# Patient Record
Sex: Female | Born: 1970 | Race: White | Hispanic: No | Marital: Married | Smoking: Never smoker
Health system: Southern US, Community
[De-identification: ages and names within clinical notes are randomized; demographics above are authoritative.]

## PROBLEM LIST (undated history)

## (undated) DIAGNOSIS — T7840XA Allergy, unspecified, initial encounter: Secondary | ICD-10-CM

## (undated) DIAGNOSIS — K219 Gastro-esophageal reflux disease without esophagitis: Secondary | ICD-10-CM

## (undated) DIAGNOSIS — F32A Depression, unspecified: Secondary | ICD-10-CM

## (undated) DIAGNOSIS — E119 Type 2 diabetes mellitus without complications: Secondary | ICD-10-CM

## (undated) DIAGNOSIS — G4730 Sleep apnea, unspecified: Secondary | ICD-10-CM

## (undated) DIAGNOSIS — F419 Anxiety disorder, unspecified: Secondary | ICD-10-CM

## (undated) DIAGNOSIS — I10 Essential (primary) hypertension: Secondary | ICD-10-CM

## (undated) HISTORY — DX: Essential (primary) hypertension: I10

## (undated) HISTORY — DX: Gastro-esophageal reflux disease without esophagitis: K21.9

## (undated) HISTORY — PX: ABDOMINAL HYSTERECTOMY: SHX81

## (undated) HISTORY — PX: NASAL SINUS SURGERY: SHX719

## (undated) HISTORY — DX: Anxiety disorder, unspecified: F41.9

## (undated) HISTORY — PX: APPENDECTOMY: SHX54

## (undated) HISTORY — DX: Sleep apnea, unspecified: G47.30

## (undated) HISTORY — DX: Type 2 diabetes mellitus without complications: E11.9

## (undated) HISTORY — DX: Depression, unspecified: F32.A

## (undated) HISTORY — DX: Allergy, unspecified, initial encounter: T78.40XA

## (undated) HISTORY — PX: CHOLECYSTECTOMY: SHX55

---

## 1999-09-21 ENCOUNTER — Other Ambulatory Visit: Admission: RE | Admit: 1999-09-21 | Discharge: 1999-09-21 | Payer: Self-pay | Admitting: Family Medicine

## 2000-01-19 ENCOUNTER — Encounter: Admission: RE | Admit: 2000-01-19 | Discharge: 2000-01-19 | Payer: Self-pay | Admitting: Family Medicine

## 2000-01-19 ENCOUNTER — Encounter: Payer: Self-pay | Admitting: Family Medicine

## 2000-10-26 ENCOUNTER — Other Ambulatory Visit: Admission: RE | Admit: 2000-10-26 | Discharge: 2000-10-26 | Payer: Self-pay | Admitting: *Deleted

## 2000-12-13 ENCOUNTER — Other Ambulatory Visit: Admission: RE | Admit: 2000-12-13 | Discharge: 2000-12-13 | Payer: Self-pay | Admitting: *Deleted

## 2002-01-10 ENCOUNTER — Other Ambulatory Visit: Admission: RE | Admit: 2002-01-10 | Discharge: 2002-01-10 | Payer: Self-pay | Admitting: *Deleted

## 2014-01-29 ENCOUNTER — Ambulatory Visit (INDEPENDENT_AMBULATORY_CARE_PROVIDER_SITE_OTHER): Payer: Managed Care, Other (non HMO) | Admitting: Podiatry

## 2014-01-29 ENCOUNTER — Ambulatory Visit (INDEPENDENT_AMBULATORY_CARE_PROVIDER_SITE_OTHER): Payer: Managed Care, Other (non HMO)

## 2014-01-29 ENCOUNTER — Other Ambulatory Visit: Payer: Self-pay | Admitting: *Deleted

## 2014-01-29 ENCOUNTER — Encounter: Payer: Self-pay | Admitting: Podiatry

## 2014-01-29 VITALS — BP 118/84 | HR 87 | Resp 16 | Ht 65.0 in | Wt 138.0 lb

## 2014-01-29 DIAGNOSIS — M779 Enthesopathy, unspecified: Secondary | ICD-10-CM

## 2014-01-29 DIAGNOSIS — M722 Plantar fascial fibromatosis: Secondary | ICD-10-CM

## 2014-01-29 DIAGNOSIS — G5761 Lesion of plantar nerve, right lower limb: Secondary | ICD-10-CM

## 2014-01-29 DIAGNOSIS — G5760 Lesion of plantar nerve, unspecified lower limb: Secondary | ICD-10-CM

## 2014-01-29 NOTE — Progress Notes (Signed)
   Subjective:    Patient ID: Joan Ochoa, female    DOB: 06-17-1970, 43 y.o.   MRN: [id number]  HPI Comments: Been having trouble with my right foot it has been about 2 months , saw my primary doctor before leaving [place] and they sent me the report and xray disc. They said there was a cyst like lesion on my heel , but it wasn't the heel that was bothering me at the time. The pain was in my toes and the bottom of the right foto between 3rd and the 4th toes , it was sharp stabbing pain in the foot, shoots the pain straight up the leg. The heel seems to be a little tender to the touch. The left foot started to hurt in the heel last 3-4 weeks and between the 1st and 2nd toe that started about 1 week ago   Foot Pain      Review of Systems  All other systems reviewed and are negative.      Objective:   Physical Exam: I have reviewed her past medical history medications allergies surgeries social history review systems. Pulses are strongly palpable bilateral. Neurologic sensorium is intact per Semmes-Weinstein monofilament. Deep tendon reflexes are intact bilateral muscle strength is 5 over 5 dorsiflexors plantar flexors inverters everters all intrinsic musculature is intact. Orthopedic evaluation demonstrates all joints distal to the ankle a full range of motion without crepitation. She has pain on palpation third interdigital space of the right foot primarily palpable Mulder's click is present. Neurologic evaluation consistent with neuroma. Radiographic evaluation does not demonstrate any type of osseous abnormalities.        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  Plan: Discussed etiology pathology conservative versus surgical therapies. At this point injected the third interdigital space with Kenalog and local anesthetic.

## 2014-05-16 DEATH — deceased

## 2015-06-27 ENCOUNTER — Encounter: Payer: Self-pay | Admitting: Emergency Medicine

## 2015-06-27 ENCOUNTER — Emergency Department: Payer: BLUE CROSS/BLUE SHIELD

## 2015-06-27 ENCOUNTER — Emergency Department
Admission: EM | Admit: 2015-06-27 | Discharge: 2015-06-27 | Disposition: A | Discharge status: Home / Self Care | Payer: BLUE CROSS/BLUE SHIELD | Attending: Emergency Medicine | Admitting: Emergency Medicine

## 2015-06-27 DIAGNOSIS — N760 Acute vaginitis: Secondary | ICD-10-CM | POA: Insufficient documentation

## 2015-06-27 DIAGNOSIS — R1030 Lower abdominal pain, unspecified: Secondary | ICD-10-CM

## 2015-06-27 DIAGNOSIS — B9689 Other specified bacterial agents as the cause of diseases classified elsewhere: Secondary | ICD-10-CM

## 2015-06-27 LAB — URINALYSIS COMPLETE WITH MICROSCOPIC (ARMC ONLY)
BACTERIA UA: NONE SEEN
Bilirubin Urine: NEGATIVE
Glucose, UA: NEGATIVE mg/dL
HGB URINE DIPSTICK: NEGATIVE
LEUKOCYTES UA: NEGATIVE
Nitrite: NEGATIVE
PH: 6 (ref 5.0–8.0)
PROTEIN: NEGATIVE mg/dL
Specific Gravity, Urine: 1.021 (ref 1.005–1.030)

## 2015-06-27 LAB — WET PREP, GENITAL
Sperm: NONE SEEN
TRICH WET PREP: NONE SEEN
WBC, Wet Prep HPF POC: NONE SEEN
YEAST WET PREP: NONE SEEN

## 2015-06-27 LAB — BASIC METABOLIC PANEL
Anion gap: 7 (ref 5–15)
BUN: 15 mg/dL (ref 6–20)
CHLORIDE: 104 mmol/L (ref 101–111)
CO2: 29 mmol/L (ref 22–32)
Calcium: 9.6 mg/dL (ref 8.9–10.3)
Creatinine, Ser: 1.03 mg/dL — ABNORMAL HIGH (ref 0.44–1.00)
GFR calc Af Amer: >60 mL/min (ref >60)
GFR calc non Af Amer: >60 mL/min (ref >60)
Glucose, Bld: 138 mg/dL — ABNORMAL HIGH (ref 65–99)
POTASSIUM: 3.9 mmol/L (ref 3.5–5.1)
Sodium: 140 mmol/L (ref 135–145)

## 2015-06-27 LAB — CHLAMYDIA/NGC RT PCR (ARMC ONLY)
Chlamydia Tr: NOT DETECTED
N gonorrhoeae: NOT DETECTED

## 2015-06-27 LAB — CBC
HEMATOCRIT: 43.1 % (ref 35.0–47.0)
Hemoglobin: 14.5 g/dL (ref 12.0–16.0)
MCH: 28.2 pg (ref 26.0–34.0)
MCHC: 33.6 g/dL (ref 32.0–36.0)
MCV: 84.1 fL (ref 80.0–100.0)
Platelets: 328 10*3/uL (ref 150–440)
RBC: 5.13 MIL/uL (ref 3.80–5.20)
RDW: 13.7 % (ref 11.5–14.5)
WBC: 7.6 10*3/uL (ref 3.6–11.0)

## 2015-06-27 MED ORDER — IOHEXOL 240 MG/ML SOLN
25.0000 mL | Freq: Once | INTRAMUSCULAR | Status: AC | PRN
  Administered 2015-06-27: 25 mL via ORAL

## 2015-06-27 MED ORDER — IOHEXOL 300 MG/ML  SOLN
100.0000 mL | Freq: Once | INTRAMUSCULAR | Status: AC | PRN
  Administered 2015-06-27: 100 mL via INTRAVENOUS

## 2015-06-27 MED ORDER — METRONIDAZOLE 500 MG PO TABS
500.0000 mg | ORAL_TABLET | Freq: Two times a day (BID) | ORAL | Status: AC
Start: 2015-06-27 — End: 2015-07-04

## 2015-06-27 MED ORDER — ONDANSETRON HCL 4 MG/2ML IJ SOLN
4.0000 mg | Freq: Once | INTRAMUSCULAR | Status: AC
  Administered 2015-06-27: 4 mg via INTRAVENOUS

## 2015-06-27 MED ORDER — TRAMADOL HCL 50 MG PO TABS: 50.0000 mg | ORAL_TABLET | Freq: Four times a day (QID) | ORAL | Status: AC | PRN

## 2015-06-27 MED ORDER — ONDANSETRON HCL 4 MG/2ML IJ SOLN
4.0000 mg | Freq: Once | INTRAMUSCULAR | Status: DC
  Filled 2015-06-27: qty 2

## 2015-06-27 MED ORDER — FENTANYL CITRATE (PF) 100 MCG/2ML IJ SOLN
50.0000 ug | Freq: Once | INTRAMUSCULAR | Status: AC
  Administered 2015-06-27: 50 ug via INTRAVENOUS
  Filled 2015-06-27: qty 2

## 2015-06-27 MED ORDER — KETOROLAC TROMETHAMINE 60 MG/2ML IM SOLN
INTRAMUSCULAR | Status: AC
  Filled 2015-06-27: qty 2

## 2015-06-27 NOTE — ED Provider Notes (Signed)
[facility] Emergency Department Provider Note  Time seen: 2:42 PM  I have reviewed the triage vital signs and the nursing notes.   HISTORY  Chief Complaint Pelvic Pain    HPI Joan Ochoa is a 45 y.o. female with no past medical history, status post hysterectomy in 2008, presents the emergency department with lower abdominal pain which began at 4 PM yesterday. According to the patient beginning around 4 PM yesterday she has been experiencing sharp severe pain in her mid to left lower abdomen. Denies dysuria but states she has been experiencing urinary frequency. Denies vaginal bleeding or discharge. Patient had a hysterectomy in 2008. Denies nausea, vomiting, diarrhea. Denies black or bloody stool. Denies abdominal injuries. Describes abdominal pain as moderate to severe, mostly located in the lower mid abdomen to left lower quadrant.     History reviewed. No pertinent past medical history.  There are no active problems to display for this patient.   Past Surgical History  Procedure Laterality Date  . Nasal sinus surgery    . Abdominal hysterectomy    . Cholecystectomy    . Appendectomy      Current Outpatient Rx  Name  Route  Sig  Dispense  Refill  . ALPRAZolam (XANAX) 0.25 MG tablet               . ketoconazole (NIZORAL) 2 % cream               . losartan (COZAAR) 50 MG tablet               . topiramate (TOPAMAX) 50 MG tablet                 Allergies Codeine and Prednisone  No family history on file.  Social History Social History  Substance Use Topics  . Smoking status: Never Smoker   . Smokeless tobacco: Never Used  . Alcohol Use: No    Review of Systems Constitutional: Negative for fever. Cardiovascular: Negative for chest pain. Respiratory: Negative for shortness of breath. Gastrointestinal: Positive for lower abdominal pain. Negative for nausea, vomiting, diarrhea Genitourinary: Negative for dysuria.  Negative for vaginal bleeding or discharge Neurological: Negative for headache 10-point ROS otherwise negative.  ____________________________________________   PHYSICAL EXAM:  VITAL SIGNS: ED Triage Vitals  Enc Vitals Group     BP 06/27/15 1228 142/91 mmHg     Pulse Rate 06/27/15 1224 95     Resp 06/27/15 1224 18     Temp 06/27/15 1224 98.1 F (36.7 C)     Temp Source 06/27/15 1224 Oral     SpO2 06/27/15 1224 98 %     Weight 06/27/15 1224 170 lb (77.111 kg)     Height 06/27/15 1224 5\' 5"  (1.651 m)     Head Cir --      Peak Flow --      Pain Score 06/27/15 1228 9     Pain Loc --      Pain Edu? --      Excl. in [place]? --     Constitutional: Alert and oriented. Well appearing and in no distress. Eyes: Normal exam ENT   Head: Normocephalic and atraumatic.   Mouth/Throat: Mucous membranes are moist. Cardiovascular: Normal rate, regular rhythm. No murmur Respiratory: Normal respiratory effort without tachypnea nor retractions. Breath sounds are clear Gastrointestinal: Soft, moderate lower abdominal, right lower and left lower quadrants as well as suprapubic area tenderness to palpation. No rebound or guarding. No distention.  Musculoskeletal: Nontender with normal range of motion in all extremities.  Neurologic:  Normal speech and language. No gross focal neurologic deficits Skin:  Skin is warm, dry and intact.  Psychiatric: Mood and affect are normal. Speech and behavior are normal.   ____________________________________________     RADIOLOGY  CT pending  ____________________________________________    INITIAL IMPRESSION / ASSESSMENT AND PLAN / ED COURSE  Pertinent labs & imaging results that were available during my care of the patient were reviewed by me and considered in my medical decision making (see chart for details).  Patient presents the emergency department with lower abdominal pain which began at 4 PM yesterday. Patient has no upper abdominal pain on  exam. Patient is moderate tenderness palpation over the entire lower abdomen but mostly in the suprapubic region. Denies vaginal symptoms. Patient is status post hysterectomy 2008. We will perform a pelvic examination, and likely proceed with CT scan of the abdomen/pelvis to further evaluate.  Pelvic exam shows a normal amount of discharge, no adnexal or cervical motion/midline tenderness. Patient is feeling much better status post fentanyl. We will proceed with a CT abdomen/pelvis to further evaluate. CT pending, patient care signed out to Dr. Archie Balboa.  ____________________________________________   FINAL CLINICAL IMPRESSION(S) / ED DIAGNOSES  Lower abdominal pain   Harvest Dark, MD 06/27/15 1505

## 2015-06-27 NOTE — ED Notes (Signed)
MD Paduchowski at bedside  

## 2015-06-27 NOTE — ED Notes (Signed)
Pt verbalized understanding of discharge instructions. NAD at this time. 

## 2015-06-27 NOTE — ED Provider Notes (Signed)
-----------------------------------------   4:14 PM on 06/27/2015 -----------------------------------------  Patient CT scan without obvious etiology of the patient's pain. It did show some renal calculi however none in the ureter. I discussed these findings with the patient. Pelvic swab however did come back positive for clue cells. Will add on Flagyl to prescription for tramadol that Dr. Kerman Passey prepared.   Nance Pear, MD 06/27/15 [phone]

## 2015-06-27 NOTE — Discharge Instructions (Signed)
Abdominal Pain, Adult Many things can cause abdominal pain. Usually, abdominal pain is not caused by a disease and will improve without treatment. It can often be observed and treated at home. Your health care provider will do a physical exam and possibly order blood tests and X-rays to help determine the seriousness of your pain. However, in many cases, more time must pass before a clear cause of the pain can be found. Before that point, your health care provider may not know if you need more testing or further treatment. HOME CARE INSTRUCTIONS Monitor your abdominal pain for any changes. The following actions may help to alleviate any discomfort you are experiencing:  Only take over-the-counter or prescription medicines as directed by your health care provider.  Do not take laxatives unless directed to do so by your health care provider.  Try a clear liquid diet (broth, tea, or water) as directed by your health care provider. Slowly move to a bland diet as tolerated. SEEK MEDICAL CARE IF:  You have unexplained abdominal pain.  You have abdominal pain associated with nausea or diarrhea.  You have pain when you urinate or have a bowel movement.  You experience abdominal pain that wakes you in the night.  You have abdominal pain that is worsened or improved by eating food.  You have abdominal pain that is worsened with eating fatty foods.  You have a fever. SEEK IMMEDIATE MEDICAL CARE IF:  Your pain does not go away within 2 hours.  You keep throwing up (vomiting).  Your pain is felt only in portions of the abdomen, such as the right side or the left lower portion of the abdomen.  You pass bloody or black tarry stools. MAKE SURE YOU:  Understand these instructions.  Will watch your condition.  Will get help right away if you are not doing well or get worse.   This information is not intended to replace advice given to you by your health care provider. Make sure you discuss  any questions you have with your health care provider.   Document Released: 02/09/2005 Document Revised: 01/21/2015 Document Reviewed: 01/09/2013 Elsevier Interactive Patient Education 2016 Elsevier [employer]  Bacterial Vaginosis Bacterial vaginosis is an infection of the vagina. It happens when too many germs (bacteria) grow in the vagina. Having this infection puts you at risk for getting other infections from sex. Treating this infection can help lower your risk for other infections, such as:   Chlamydia.  Gonorrhea.  HIV.  Herpes. HOME CARE  Take your medicine as told by your doctor.  Finish your medicine even if you start to feel better.  Tell your sex partner that you have an infection. They should see their doctor for treatment.  During treatment:  Avoid sex or use condoms correctly.  Do not douche.  Do not drink alcohol unless your doctor tells you it is ok.  Do not breastfeed unless your doctor tells you it is ok. GET HELP IF:  You are not getting better after 3 days of treatment.  You have more grey fluid (discharge) coming from your vagina than before.  You have more pain than before.  You have a fever. MAKE SURE YOU:   Understand these instructions.  Will watch your condition.  Will get help right away if you are not doing well or get worse.   This information is not intended to replace advice given to you by your health care provider. Make sure you discuss any questions you have with  your health care provider.   Document Released: 02/09/2008 Document Revised: 05/23/2014 Document Reviewed: 12/12/2012 Elsevier Interactive Patient Education [employer].

## 2015-06-27 NOTE — ED Notes (Addendum)
C/O SUPRAPUBIC PAIN. ONSET OF SYMPTOMS LAST NIGHT.  REPORTS FREQUENCY WITH VOIDING  OVER THE PAST FEW DAYS.  DENIES VAGINAL DISCHARGE.  ALSO C/O LOW BACK PAIN THAT STARTED THIS AM.  INITIALLY PAIN WAS LOW AND CENTER, NOW MORE TO THE LEFT SIDE.  C/O NAUSEA WITH PAIN

## 2017-08-18 DIAGNOSIS — I10 Essential (primary) hypertension: Secondary | ICD-10-CM | POA: Insufficient documentation

## 2017-08-18 DIAGNOSIS — K219 Gastro-esophageal reflux disease without esophagitis: Secondary | ICD-10-CM | POA: Insufficient documentation

## 2017-08-22 DIAGNOSIS — E782 Mixed hyperlipidemia: Secondary | ICD-10-CM | POA: Insufficient documentation

## 2017-08-22 DIAGNOSIS — F32A Depression, unspecified: Secondary | ICD-10-CM | POA: Insufficient documentation

## 2017-08-22 DIAGNOSIS — F329 Major depressive disorder, single episode, unspecified: Secondary | ICD-10-CM | POA: Insufficient documentation

## 2017-08-22 DIAGNOSIS — G43009 Migraine without aura, not intractable, without status migrainosus: Secondary | ICD-10-CM | POA: Insufficient documentation

## 2017-08-22 DIAGNOSIS — R7303 Prediabetes: Secondary | ICD-10-CM | POA: Insufficient documentation

## 2017-08-22 DIAGNOSIS — E1169 Type 2 diabetes mellitus with other specified complication: Secondary | ICD-10-CM | POA: Insufficient documentation

## 2017-08-22 DIAGNOSIS — F419 Anxiety disorder, unspecified: Secondary | ICD-10-CM | POA: Insufficient documentation

## 2017-08-22 DIAGNOSIS — E119 Type 2 diabetes mellitus without complications: Secondary | ICD-10-CM | POA: Insufficient documentation

## 2017-08-22 DIAGNOSIS — F4310 Post-traumatic stress disorder, unspecified: Secondary | ICD-10-CM | POA: Insufficient documentation

## 2018-05-31 ENCOUNTER — Ambulatory Visit: Payer: BLUE CROSS/BLUE SHIELD | Admitting: Podiatry

## 2018-05-31 ENCOUNTER — Ambulatory Visit (INDEPENDENT_AMBULATORY_CARE_PROVIDER_SITE_OTHER): Payer: BLUE CROSS/BLUE SHIELD

## 2018-05-31 ENCOUNTER — Encounter: Payer: Self-pay | Admitting: Podiatry

## 2018-05-31 DIAGNOSIS — G5782 Other specified mononeuropathies of left lower limb: Secondary | ICD-10-CM

## 2018-05-31 DIAGNOSIS — M7752 Other enthesopathy of left foot: Secondary | ICD-10-CM | POA: Diagnosis not present

## 2018-05-31 DIAGNOSIS — M778 Other enthesopathies, not elsewhere classified: Secondary | ICD-10-CM

## 2018-05-31 DIAGNOSIS — M7751 Other enthesopathy of right foot: Secondary | ICD-10-CM | POA: Diagnosis not present

## 2018-05-31 DIAGNOSIS — M779 Enthesopathy, unspecified: Principal | ICD-10-CM

## 2018-05-31 DIAGNOSIS — G5761 Lesion of plantar nerve, right lower limb: Secondary | ICD-10-CM | POA: Diagnosis not present

## 2018-05-31 DIAGNOSIS — G5762 Lesion of plantar nerve, left lower limb: Secondary | ICD-10-CM

## 2018-05-31 DIAGNOSIS — G5781 Other specified mononeuropathies of right lower limb: Secondary | ICD-10-CM

## 2018-05-31 NOTE — Progress Notes (Signed)
She presents today chief complaint of pain beneath the third and fourth toes of the bilateral foot right greater than left.  She states burning and stinging times the past 2 months she states that seems to be getting worse she is tried Tylenol ibuprofen and tramadol really nothing seems to be helping.  Objective: Vital signs are stable she is alert oriented x3.  Pulses are palpable.  Neurologic sensorium is intact she does have a palpable Mulder's click third interspace bilateral right greater than left.  Deep tendon flexors are intact muscle strength is normal symmetrical.  Orthopedic evaluation of straight all joints distal ankle full range of motion without crepitation.  Cutaneous evaluation demonstrates supple well-hydrated cutis no erythema edema cellulitis drainage or odor.  Radiographs taken today do not demonstrate any significant osseous findings.  Assessment: Neuroma third interspace bilateral.  Plan: After sterile Betadine skin prep I injected 10 mg Kenalog 5 mg Marcaine point maximal tenderness of the third interspace bilateral.  Follow-up with her in a few weeks if necessary.

## 2019-08-19 DIAGNOSIS — M25561 Pain in right knee: Secondary | ICD-10-CM | POA: Diagnosis not present

## 2019-08-19 DIAGNOSIS — M1712 Unilateral primary osteoarthritis, left knee: Secondary | ICD-10-CM | POA: Diagnosis not present

## 2019-08-19 DIAGNOSIS — M1711 Unilateral primary osteoarthritis, right knee: Secondary | ICD-10-CM | POA: Diagnosis not present

## 2019-08-19 DIAGNOSIS — M25562 Pain in left knee: Secondary | ICD-10-CM | POA: Diagnosis not present

## 2019-10-07 DIAGNOSIS — Z1329 Encounter for screening for other suspected endocrine disorder: Secondary | ICD-10-CM | POA: Diagnosis not present

## 2019-10-07 DIAGNOSIS — I10 Essential (primary) hypertension: Secondary | ICD-10-CM | POA: Diagnosis not present

## 2019-10-07 DIAGNOSIS — E782 Mixed hyperlipidemia: Secondary | ICD-10-CM | POA: Diagnosis not present

## 2019-10-07 DIAGNOSIS — R7303 Prediabetes: Secondary | ICD-10-CM | POA: Diagnosis not present

## 2019-10-07 DIAGNOSIS — K219 Gastro-esophageal reflux disease without esophagitis: Secondary | ICD-10-CM | POA: Diagnosis not present

## 2019-10-15 DIAGNOSIS — F4312 Post-traumatic stress disorder, chronic: Secondary | ICD-10-CM | POA: Diagnosis not present

## 2019-10-15 DIAGNOSIS — R69 Illness, unspecified: Secondary | ICD-10-CM | POA: Diagnosis not present

## 2019-10-31 DIAGNOSIS — E782 Mixed hyperlipidemia: Secondary | ICD-10-CM | POA: Diagnosis not present

## 2019-10-31 DIAGNOSIS — Z1329 Encounter for screening for other suspected endocrine disorder: Secondary | ICD-10-CM | POA: Diagnosis not present

## 2019-10-31 DIAGNOSIS — I10 Essential (primary) hypertension: Secondary | ICD-10-CM | POA: Diagnosis not present

## 2019-10-31 DIAGNOSIS — R7303 Prediabetes: Secondary | ICD-10-CM | POA: Diagnosis not present

## 2019-11-07 DIAGNOSIS — I10 Essential (primary) hypertension: Secondary | ICD-10-CM | POA: Diagnosis not present

## 2019-11-07 DIAGNOSIS — K219 Gastro-esophageal reflux disease without esophagitis: Secondary | ICD-10-CM | POA: Diagnosis not present

## 2019-11-07 DIAGNOSIS — B0229 Other postherpetic nervous system involvement: Secondary | ICD-10-CM | POA: Diagnosis not present

## 2019-11-07 DIAGNOSIS — E119 Type 2 diabetes mellitus without complications: Secondary | ICD-10-CM | POA: Diagnosis not present

## 2019-11-07 DIAGNOSIS — E782 Mixed hyperlipidemia: Secondary | ICD-10-CM | POA: Diagnosis not present

## 2019-11-07 DIAGNOSIS — R69 Illness, unspecified: Secondary | ICD-10-CM | POA: Diagnosis not present

## 2019-11-07 DIAGNOSIS — Z1389 Encounter for screening for other disorder: Secondary | ICD-10-CM | POA: Diagnosis not present

## 2019-11-07 DIAGNOSIS — Z0000 Encounter for general adult medical examination without abnormal findings: Secondary | ICD-10-CM | POA: Diagnosis not present

## 2019-11-27 DIAGNOSIS — I10 Essential (primary) hypertension: Secondary | ICD-10-CM | POA: Diagnosis not present

## 2019-11-27 DIAGNOSIS — R0789 Other chest pain: Secondary | ICD-10-CM | POA: Diagnosis not present

## 2019-11-27 DIAGNOSIS — E782 Mixed hyperlipidemia: Secondary | ICD-10-CM | POA: Diagnosis not present

## 2019-11-27 DIAGNOSIS — H9201 Otalgia, right ear: Secondary | ICD-10-CM | POA: Diagnosis not present

## 2019-11-27 DIAGNOSIS — E119 Type 2 diabetes mellitus without complications: Secondary | ICD-10-CM | POA: Diagnosis not present

## 2019-12-16 DIAGNOSIS — S6991XA Unspecified injury of right wrist, hand and finger(s), initial encounter: Secondary | ICD-10-CM | POA: Diagnosis not present

## 2019-12-16 DIAGNOSIS — R110 Nausea: Secondary | ICD-10-CM | POA: Diagnosis not present

## 2019-12-16 DIAGNOSIS — S7002XA Contusion of left hip, initial encounter: Secondary | ICD-10-CM | POA: Diagnosis not present

## 2019-12-16 DIAGNOSIS — M542 Cervicalgia: Secondary | ICD-10-CM | POA: Diagnosis not present

## 2019-12-16 DIAGNOSIS — S4992XA Unspecified injury of left shoulder and upper arm, initial encounter: Secondary | ICD-10-CM | POA: Diagnosis not present

## 2019-12-16 DIAGNOSIS — M25512 Pain in left shoulder: Secondary | ICD-10-CM | POA: Diagnosis not present

## 2019-12-16 DIAGNOSIS — Z23 Encounter for immunization: Secondary | ICD-10-CM | POA: Diagnosis not present

## 2019-12-16 DIAGNOSIS — Y92410 Unspecified street and highway as the place of occurrence of the external cause: Secondary | ICD-10-CM | POA: Diagnosis not present

## 2019-12-16 DIAGNOSIS — S79912A Unspecified injury of left hip, initial encounter: Secondary | ICD-10-CM | POA: Diagnosis not present

## 2019-12-16 DIAGNOSIS — S6992XA Unspecified injury of left wrist, hand and finger(s), initial encounter: Secondary | ICD-10-CM | POA: Diagnosis not present

## 2019-12-16 DIAGNOSIS — S59902A Unspecified injury of left elbow, initial encounter: Secondary | ICD-10-CM | POA: Diagnosis not present

## 2019-12-16 DIAGNOSIS — S70212A Abrasion, left hip, initial encounter: Secondary | ICD-10-CM | POA: Diagnosis not present

## 2019-12-16 DIAGNOSIS — S300XXA Contusion of lower back and pelvis, initial encounter: Secondary | ICD-10-CM | POA: Diagnosis not present

## 2019-12-16 DIAGNOSIS — R109 Unspecified abdominal pain: Secondary | ICD-10-CM | POA: Diagnosis not present

## 2019-12-16 DIAGNOSIS — S59901A Unspecified injury of right elbow, initial encounter: Secondary | ICD-10-CM | POA: Diagnosis not present

## 2019-12-16 DIAGNOSIS — S8992XA Unspecified injury of left lower leg, initial encounter: Secondary | ICD-10-CM | POA: Diagnosis not present

## 2019-12-16 DIAGNOSIS — Z041 Encounter for examination and observation following transport accident: Secondary | ICD-10-CM | POA: Diagnosis not present

## 2019-12-16 DIAGNOSIS — S4991XA Unspecified injury of right shoulder and upper arm, initial encounter: Secondary | ICD-10-CM | POA: Diagnosis not present

## 2019-12-16 DIAGNOSIS — Z043 Encounter for examination and observation following other accident: Secondary | ICD-10-CM | POA: Diagnosis not present

## 2019-12-16 DIAGNOSIS — G8911 Acute pain due to trauma: Secondary | ICD-10-CM | POA: Diagnosis not present

## 2019-12-16 DIAGNOSIS — S30811A Abrasion of abdominal wall, initial encounter: Secondary | ICD-10-CM | POA: Diagnosis not present

## 2019-12-16 DIAGNOSIS — T1490XA Injury, unspecified, initial encounter: Secondary | ICD-10-CM | POA: Diagnosis not present

## 2020-01-03 DIAGNOSIS — R519 Headache, unspecified: Secondary | ICD-10-CM | POA: Diagnosis not present

## 2020-01-03 DIAGNOSIS — Z20822 Contact with and (suspected) exposure to covid-19: Secondary | ICD-10-CM | POA: Diagnosis not present

## 2020-01-03 DIAGNOSIS — R509 Fever, unspecified: Secondary | ICD-10-CM | POA: Diagnosis not present

## 2020-01-03 DIAGNOSIS — Z03818 Encounter for observation for suspected exposure to other biological agents ruled out: Secondary | ICD-10-CM | POA: Diagnosis not present

## 2020-01-07 DIAGNOSIS — J0100 Acute maxillary sinusitis, unspecified: Secondary | ICD-10-CM | POA: Diagnosis not present

## 2020-01-07 DIAGNOSIS — R42 Dizziness and giddiness: Secondary | ICD-10-CM | POA: Diagnosis not present

## 2020-02-03 DIAGNOSIS — J0101 Acute recurrent maxillary sinusitis: Secondary | ICD-10-CM | POA: Diagnosis not present

## 2020-02-03 DIAGNOSIS — R300 Dysuria: Secondary | ICD-10-CM | POA: Diagnosis not present

## 2020-02-03 DIAGNOSIS — Z03818 Encounter for observation for suspected exposure to other biological agents ruled out: Secondary | ICD-10-CM | POA: Diagnosis not present

## 2020-04-01 ENCOUNTER — Other Ambulatory Visit: Payer: Self-pay

## 2020-04-01 ENCOUNTER — Ambulatory Visit: Payer: Medicare HMO | Admitting: Dermatology

## 2020-04-01 DIAGNOSIS — Z1283 Encounter for screening for malignant neoplasm of skin: Secondary | ICD-10-CM

## 2020-04-01 DIAGNOSIS — L814 Other melanin hyperpigmentation: Secondary | ICD-10-CM | POA: Diagnosis not present

## 2020-04-01 DIAGNOSIS — D229 Melanocytic nevi, unspecified: Secondary | ICD-10-CM | POA: Diagnosis not present

## 2020-04-01 DIAGNOSIS — D485 Neoplasm of uncertain behavior of skin: Secondary | ICD-10-CM | POA: Diagnosis not present

## 2020-04-01 DIAGNOSIS — D1800 Hemangioma unspecified site: Secondary | ICD-10-CM

## 2020-04-01 DIAGNOSIS — L820 Inflamed seborrheic keratosis: Secondary | ICD-10-CM

## 2020-04-01 DIAGNOSIS — L821 Other seborrheic keratosis: Secondary | ICD-10-CM

## 2020-04-01 DIAGNOSIS — L578 Other skin changes due to chronic exposure to nonionizing radiation: Secondary | ICD-10-CM

## 2020-04-01 NOTE — Patient Instructions (Addendum)
Melanoma ABCDEs  Melanoma is the most dangerous type of skin cancer, and is the leading cause of death from skin disease.  You are more likely to develop melanoma if you:  Have light-colored skin, light-colored eyes, or red or blond hair  Spend a lot of time in the sun  Tan regularly, either outdoors or in a tanning bed  Have had blistering sunburns, especially during childhood  Have a close family member who has had a melanoma  Have atypical moles or large birthmarks  Early detection of melanoma is key since treatment is typically straightforward and cure rates are extremely high if we catch it early.   The first sign of melanoma is often a change in a mole or a new dark spot.  The ABCDE system is a way of remembering the signs of melanoma.  A for asymmetry:  The two halves do not match. B for border:  The edges of the growth are irregular. C for color:  A mixture of colors are present instead of an even brown color. D for diameter:  Melanomas are usually (but not always) greater than 87mm - the size of a pencil eraser. E for evolution:  The spot keeps changing in size, shape, and color.  Please check your skin once per month between visits. You can use a small mirror in front and a large mirror behind you to keep an eye on the back side or your body.   If you see any new or changing lesions before your next follow-up, please call to schedule a visit.  Please continue daily skin protection including broad spectrum sunscreen SPF 30+ to sun-exposed areas, reapplying every 2 hours as needed when you're outdoors.   Wound Care Instructions  1. Cleanse wound gently with soap and water once a day then pat dry with clean gauze. Apply a thing coat of Petrolatum (petroleum jelly, "Vaseline") over the wound (unless you have an allergy to this). We recommend that you use a new, sterile tube of Vaseline. Do not pick or remove scabs. Do not remove the yellow or white "healing tissue" from the  base of the wound.  2. Cover the wound with fresh, clean, nonstick gauze and secure with paper tape. You may use Band-Aids in place of gauze and tape if the would is small enough, but would recommend trimming much of the tape off as there is often too much. Sometimes Band-Aids can irritate the skin.  3. You should call the office for your biopsy report after 1 week if you have not already been contacted.  4. If you experience any problems, such as abnormal amounts of bleeding, swelling, significant bruising, significant pain, or evidence of infection, please call the office immediately.  5. FOR ADULT SURGERY PATIENTS: If you need something for pain relief you may take 1 extra strength Tylenol (acetaminophen) AND 2 Ibuprofen (200mg  each) together every 4 hours as needed for pain. (do not take these if you are allergic to them or if you have a reason you should not take them.) Typically, you may only need pain medication for 1 to 3 days.   Recommend CeraVe AM moisturizing sunscreen daily.   Cryotherapy Aftercare  . Wash gently with soap and water everyday.   Marland Kitchen Apply Vaseline and Band-Aid daily until healed.  Prior to procedure, discussed risks of blister formation, small wound, skin dyspigmentation, or rare scar following cryotherapy.

## 2020-04-01 NOTE — Progress Notes (Signed)
New Patient Visit  Subjective  Joan Ochoa is a 49 y.o. female who presents for the following: TBSE.  Patient here for full body skin exam and skin cancer screening. No personal or family history of skin cancer.  Patient is not aware of anything new or changing burt does have spots she would like removed. There is one at the left upper back that itches. There are other spots that get irritated she would like removed including a couple of spots on her leg and one on her chest   The following portions of the chart were reviewed this encounter and updated as appropriate:  Tobacco  Allergies  Meds  Problems  Med Hx  Surg Hx  Fam Hx      Review of Systems:  No other skin or systemic complaints except as noted in HPI or Assessment and Plan.  Objective  Well appearing patient in no apparent distress; mood and affect are within normal limits.  A full examination was performed including scalp, head, eyes, ears, nose, lips, neck, chest, axillae, abdomen, back, buttocks, bilateral upper extremities, bilateral lower extremities, hands, feet, fingers, toes, fingernails, and toenails. All findings within normal limits unless otherwise noted below.  Objective  Left Upper Thigh: 0.9cm purple papule R/o Irritated Hemangioma vs other     Objective  Left Upper Back Medial: 0.5cm irregular thin brown papule R/o Atypia     Objective  Left Upper Back Lateral: 0.5cm erythematous pink papule R/o ISK vs Verruca vs Nevus     Objective  Right lateral calf x 1, left medial breast x 1 (2): Erythematous keratotic or waxy stuck-on papule or plaque.    Assessment & Plan  Neoplasm of uncertain behavior of skin (3) Left Upper Thigh  Epidermal / dermal shaving  Lesion diameter (cm):  0.9 Informed consent: discussed and consent obtained   Timeout: patient name, date of birth, surgical site, and procedure verified   Patient was prepped and draped in usual sterile fashion: area  prepped with isopropyl alcohol. Anesthesia: the lesion was anesthetized in a standard fashion   Anesthetic:  1% lidocaine w/ epinephrine 1-100,000 buffered w/ 8.4% NaHCO3 Instrument used: flexible razor blade   Hemostasis achieved with: aluminum chloride   Outcome: patient tolerated procedure well   Post-procedure details: wound care instructions given   Additional details:  Mupirocin and a bandage applied  Specimen 2 - Surgical pathology Differential Diagnosis:  Check Margins: No 0.9cm purple papule R/o Irritated Hemangioma vs other  Left Upper Back Medial  Epidermal / dermal shaving  Lesion diameter (cm):  0.5 Informed consent: discussed and consent obtained   Timeout: patient name, date of birth, surgical site, and procedure verified   Patient was prepped and draped in usual sterile fashion: area prepped with isopropyl alcohol. Anesthesia: the lesion was anesthetized in a standard fashion   Anesthetic:  1% lidocaine w/ epinephrine 1-100,000 buffered w/ 8.4% NaHCO3 Instrument used: flexible razor blade   Hemostasis achieved with: aluminum chloride   Outcome: patient tolerated procedure well   Post-procedure details: wound care instructions given   Additional details:  Mupirocin and a bandage applied  Specimen 3 - Surgical pathology Differential Diagnosis:  Check Margins: No 0.5cm irregular thin brown papule R/o Atypia  Left Upper Back Lateral  Epidermal / dermal shaving  Lesion diameter (cm):  0.5 Informed consent: discussed and consent obtained   Timeout: patient name, date of birth, surgical site, and procedure verified   Patient was prepped and draped in usual sterile  fashion: area prepped with isopropyl alcohol. Anesthesia: the lesion was anesthetized in a standard fashion   Anesthetic:  1% lidocaine w/ epinephrine 1-100,000 buffered w/ 8.4% NaHCO3 Instrument used: flexible razor blade   Hemostasis achieved with: aluminum chloride   Outcome: patient tolerated  procedure well   Post-procedure details: wound care instructions given   Additional details:  Mupirocin and a bandage applied  Specimen 1 - Surgical pathology Differential Diagnosis:  Check Margins: No 0.5cm erythematous pink papule R/o ISK vs Verruca vs Nevus  Inflamed seborrheic keratosis (2) Right lateral calf x 1, left medial breast x 1  Prior to procedure, discussed risks of blister formation, small wound, skin dyspigmentation, or rare scar following cryotherapy.    Destruction of lesion - Right lateral calf x 1, left medial breast x 1 Complexity: simple   Destruction method: cryotherapy   Informed consent: discussed and consent obtained   Lesion destroyed using liquid nitrogen: Yes   Cryotherapy cycles:  2 Outcome: patient tolerated procedure well with no complications   Post-procedure details: wound care instructions given    Lentigines - Scattered tan macules - Discussed due to sun exposure - Benign, observe - Call for any changes  Seborrheic Keratoses - Stuck-on, waxy, tan-brown papules and plaques  - Discussed benign etiology and prognosis. - Observe - Call for any changes  Melanocytic Nevi - Tan-brown and/or pink-flesh-colored symmetric macules and papules - Benign appearing on exam today - Observation - Call clinic for new or changing moles - Recommend daily use of broad spectrum spf 30+ sunscreen to sun-exposed areas.   Hemangiomas - Red papules - Discussed benign nature - Observe - Call for any changes  Actinic Damage - Chronic, secondary to cumulative UV/sun exposure - diffuse scaly erythematous macules with underlying dyspigmentation - Recommend daily broad spectrum sunscreen SPF 30+ to sun-exposed areas, reapply every 2 hours as needed.  - Call for new or changing lesions.  Skin cancer screening performed today.   Return in about 1 year (around 04/01/2021) for TBSE, 6 week ISK follow up.  Graciella Belton, RMA, am acting as scribe for  Forest Gleason, MD .  Documentation: I have reviewed the above documentation for accuracy and completeness, and I agree with the above.  Forest Gleason, MD

## 2020-04-07 ENCOUNTER — Telehealth: Payer: Self-pay

## 2020-04-07 NOTE — Telephone Encounter (Signed)
Patient advised of all biopsy results and scheduled for surgery.

## 2020-04-07 NOTE — Progress Notes (Signed)
1. Skin , left upper back lateral SEBORRHEIC KERATOSIS, IRRITATED -->   This is a benign growth or "wisdom spot". No additional treatment is needed. However, if it is still symptomatic, we can treat in clinic with liquid nitrogen.  2. Skin , left upper thigh ANGIOMA  This is a benign blood vessel growth.  No additional treatment is needed.  3. 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 On the spectrum from normal mole to melanoma skin cancer, this is in between the two but closer towards the melanoma skin cancer.   -The treatment of choice for severely atypical moles is to do a surgery to cut them out with an area of normal looking skin around them to be sure that we remove all of the atypical cells, so we need to schedule a surgery appointment to get this spot taken care of.  - People who have a history of atypical moles do have a slightly increased risk of developing melanoma somewhere on the body, so a yearly full body skin exam by a dermatologist is recommended.   - Monthly self skin checks and daily sun protection are also recommended.   - Please call if you notice a dark spot coming back where this biopsy was taken.   - Please also call if you notice any new or changing spots anywhere else on the body before your follow-up visit.   I left a voicemail 04/07/2020. Also spoke with someone at other phone number who said Joan Ochoa is out of town in [place] until November 29.  We will send a MyChart message since she will see the results in the portal anyway and will ask her to call us when she can to answer questions and schedule for excision.

## 2020-04-13 ENCOUNTER — Encounter: Payer: Self-pay | Admitting: Dermatology

## 2020-05-14 ENCOUNTER — Other Ambulatory Visit: Admission: RE | Admit: 2020-05-14 | Payer: Medicare HMO | Source: Ambulatory Visit

## 2020-05-19 ENCOUNTER — Ambulatory Visit: Payer: Medicare HMO | Admitting: Dermatology

## 2020-05-28 LAB — HM COLONOSCOPY

## 2020-06-02 ENCOUNTER — Ambulatory Visit: Payer: Medicare Other | Admitting: Dermatology

## 2020-06-02 ENCOUNTER — Other Ambulatory Visit: Payer: Self-pay

## 2020-06-02 DIAGNOSIS — Z86018 Personal history of other benign neoplasm: Secondary | ICD-10-CM

## 2020-06-02 DIAGNOSIS — D485 Neoplasm of uncertain behavior of skin: Secondary | ICD-10-CM | POA: Diagnosis not present

## 2020-06-02 HISTORY — DX: Personal history of other benign neoplasm: Z86.018

## 2020-06-02 MED ORDER — MUPIROCIN 2 % EX OINT: 1.0000 "application " | TOPICAL_OINTMENT | Freq: Every day | CUTANEOUS | 0 refills | Status: DC

## 2020-06-02 NOTE — Progress Notes (Signed)
° °  Follow-Up Visit   Subjective  Joan Ochoa is a 50 y.o. 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 ).   The following portions of the chart were reviewed this encounter and updated as appropriate:       Review of Systems:  No other skin or systemic complaints except as noted in HPI or Assessment and Plan.  Objective  Well appearing patient in no apparent distress; mood and affect are within normal limits.  A focused examination was performed including back. Relevant physical exam findings are noted in the Assessment and Plan.  Objective  Left Upper Back Medial: Healing biopsy site    Assessment & Plan  Neoplasm of uncertain behavior of skin Left Upper Back Medial  Skin excision  Lesion length (cm):  0.8 Lesion width (cm):  0.8 Informed consent: discussed and consent obtained   Timeout: patient name, date of birth, surgical site, and procedure verified   Procedure prep:  Patient was prepped and draped in usual sterile fashion Prep type:  Chlorhexidine Anesthesia: the lesion was anesthetized in a standard fashion   Anesthesia comment:  16cc Anesthetic:  1% lidocaine w/ epinephrine 1-100,000 buffered w/ 8.4% NaHCO3 Instrument used: #15 blade   Hemostasis achieved with: suture, pressure and electrodesiccation   Outcome: patient tolerated procedure well with no complications   Post-procedure details: wound care instructions given   Additional details:  Mupirocin and a pressure dressing applied  Skin repair  Final length (cm):  4.6 Informed consent: discussed and consent obtained   Timeout: patient name, date of birth, surgical site, and procedure verified   Procedure prep:  Patient was prepped and draped in usual sterile fashion Prep type:  Chlorhexidine Anesthesia: the lesion was anesthetized in a standard fashion   Anesthetic:  1% lidocaine w/  epinephrine 1-100,000 local infiltration Undermining: edges undermined   Fine/surface layer approximation (top stitches):  Hemostasis achieved with: suture, pressure and electrodesiccation Outcome: patient tolerated procedure well with no complications   Post-procedure details: wound care instructions given   Additional details:  Mupirocin and a pressure dressing applied  Specimen 1 - Surgical pathology Differential Diagnosis: 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  Graciella Belton, RMA, am acting as scribe for Forest Gleason, MD .  Documentation: I have reviewed the above documentation for accuracy and completeness, and I agree with the above.  Forest Gleason, MD

## 2020-06-02 NOTE — Patient Instructions (Signed)

## 2020-06-03 ENCOUNTER — Encounter: Payer: Self-pay | Admitting: Dermatology

## 2020-06-03 ENCOUNTER — Telehealth: Payer: Self-pay

## 2020-06-03 NOTE — Telephone Encounter (Signed)
Patient doing well after yesterdays surgery, Joan Ochoa 

## 2020-06-09 ENCOUNTER — Encounter: Payer: Self-pay | Admitting: Dermatology

## 2020-06-09 ENCOUNTER — Other Ambulatory Visit: Payer: Self-pay

## 2020-06-09 ENCOUNTER — Ambulatory Visit (INDEPENDENT_AMBULATORY_CARE_PROVIDER_SITE_OTHER): Payer: Medicare Other | Admitting: Dermatology

## 2020-06-09 DIAGNOSIS — D239 Other benign neoplasm of skin, unspecified: Secondary | ICD-10-CM | POA: Diagnosis not present

## 2020-06-09 DIAGNOSIS — L821 Other seborrheic keratosis: Secondary | ICD-10-CM

## 2020-06-09 DIAGNOSIS — Z4802 Encounter for removal of sutures: Secondary | ICD-10-CM

## 2020-06-09 NOTE — Progress Notes (Signed)
   Follow-Up Visit   Subjective  Joan Ochoa is a 50 y.o. female who presents for the following: Follow-up (Patient here today for suture removal at left upper back medial. Excision on 06/02/20 for bx proven MODERATE TO SEVERELY ATYPICAL MOLE. ).  She also would like me to check the spot treated with LN2 at her right leg and some spots on her abdomen.  The following portions of the chart were reviewed this encounter and updated as appropriate:   Tobacco  Allergies  Meds  Problems  Med Hx  Surg Hx  Fam Hx      Review of Systems:  No other skin or systemic complaints except as noted in HPI or Assessment and Plan.  Objective  Well appearing patient in no apparent distress; mood and affect are within normal limits.  A focused examination was performed including back, abdomen, right leg. Relevant physical exam findings are noted in the Assessment and Plan.    Assessment & Plan    Encounter for Removal of Sutures - Incision site at the left upper back medial is clean, dry and intact - Wound cleansed, sutures removed, wound cleansed and steri strips applied.  - Discussed pathology results showing margins free  - Patient advised to keep steri-strips dry until they fall off. - Scars remodel for a full year. - Once steri-strips fall off, patient can apply over-the-counter silicone scar cream each night to help with scar remodeling if desired. - Patient advised to call with any concerns or if they notice any new or changing lesions.  Seborrheic Keratoses - Stuck-on, waxy, tan-brown papules and plaques at abdomen - Discussed benign etiology and prognosis. - Observe - Call for any changes  Dermatofibroma - Firm pink/brown papule with dimple sign - Flatter and No longer symptomatic after LN2 - Benign appearing - Call for any changes   Return in about 6 months (around 12/07/2020) for TBSE.  Graciella Belton, RMA, am acting as scribe for Forest Gleason, MD .  Documentation: I  have reviewed the above documentation for accuracy and completeness, and I agree with the above.  Forest Gleason, MD

## 2020-06-09 NOTE — Patient Instructions (Addendum)
Recommend OTC Gold Bond Rapid Relief Anti-Itch cream (pramoxine + menthol) up to 3 times per day to areas that are itchy.  Serica moisturizing scar formula  Melanoma ABCDEs  Melanoma is the most dangerous type of skin cancer, and is the leading cause of death from skin disease.  You are more likely to develop melanoma if you:  Have light-colored skin, light-colored eyes, or red or blond hair  Spend a lot of time in the sun  Tan regularly, either outdoors or in a tanning bed  Have had blistering sunburns, especially during childhood  Have a close family member who has had a melanoma  Have atypical moles or large birthmarks  Early detection of melanoma is key since treatment is typically straightforward and cure rates are extremely high if we catch it early.   The first sign of melanoma is often a change in a mole or a new dark spot.  The ABCDE system is a way of remembering the signs of melanoma.  A for asymmetry:  The two halves do not match. B for border:  The edges of the growth are irregular. C for color:  A mixture of colors are present instead of an even brown color. D for diameter:  Melanomas are usually (but not always) greater than 71mm - the size of a pencil eraser. E for evolution:  The spot keeps changing in size, shape, and color.  Please check your skin once per month between visits. You can use a small mirror in front and a large mirror behind you to keep an eye on the back side or your body.   If you see any new or changing lesions before your next follow-up, please call to schedule a visit.  Please continue daily skin protection including broad spectrum sunscreen SPF 30+ to sun-exposed areas, reapplying every 2 hours as needed when you're outdoors.

## 2020-12-16 ENCOUNTER — Ambulatory Visit: Payer: Medicare Other | Admitting: Dermatology

## 2021-04-14 ENCOUNTER — Encounter: Payer: Medicare HMO | Admitting: Dermatology

## 2021-06-14 ENCOUNTER — Encounter (HOSPITAL_COMMUNITY): Payer: Self-pay | Admitting: Emergency Medicine

## 2021-06-14 ENCOUNTER — Emergency Department (HOSPITAL_COMMUNITY): Payer: Medicare Other

## 2021-06-14 ENCOUNTER — Other Ambulatory Visit: Payer: Self-pay

## 2021-06-14 ENCOUNTER — Emergency Department (HOSPITAL_COMMUNITY)
Admission: EM | Admit: 2021-06-14 | Discharge: 2021-06-14 | Disposition: A | Discharge status: Home / Self Care | Payer: Medicare Other | Attending: Emergency Medicine | Admitting: Emergency Medicine

## 2021-06-14 DIAGNOSIS — S060XAA Concussion with loss of consciousness status unknown, initial encounter: Secondary | ICD-10-CM

## 2021-06-14 DIAGNOSIS — S060X9A Concussion with loss of consciousness of unspecified duration, initial encounter: Secondary | ICD-10-CM | POA: Insufficient documentation

## 2021-06-14 DIAGNOSIS — S0990XA Unspecified injury of head, initial encounter: Secondary | ICD-10-CM | POA: Diagnosis present

## 2021-06-14 DIAGNOSIS — I10 Essential (primary) hypertension: Secondary | ICD-10-CM | POA: Diagnosis not present

## 2021-06-14 DIAGNOSIS — W108XXA Fall (on) (from) other stairs and steps, initial encounter: Secondary | ICD-10-CM | POA: Insufficient documentation

## 2021-06-14 DIAGNOSIS — M542 Cervicalgia: Secondary | ICD-10-CM | POA: Insufficient documentation

## 2021-06-14 DIAGNOSIS — S0181XA Laceration without foreign body of other part of head, initial encounter: Secondary | ICD-10-CM | POA: Diagnosis not present

## 2021-06-14 DIAGNOSIS — W19XXXA Unspecified fall, initial encounter: Secondary | ICD-10-CM

## 2021-06-14 MED ORDER — LIDOCAINE-EPINEPHRINE (PF) 2 %-1:200000 IJ SOLN
10.0000 mL | Freq: Once | INTRAMUSCULAR | Status: DC
  Filled 2021-06-14: qty 20

## 2021-06-14 MED ORDER — ONDANSETRON HCL 4 MG PO TABS: 4.0000 mg | ORAL_TABLET | Freq: Four times a day (QID) | ORAL | 0 refills | Status: DC

## 2021-06-14 MED ORDER — METOCLOPRAMIDE HCL 5 MG/ML IJ SOLN
10.0000 mg | Freq: Once | INTRAMUSCULAR | Status: AC
  Administered 2021-06-14: 10 mg via INTRAVENOUS
  Filled 2021-06-14: qty 2

## 2021-06-14 MED ORDER — FENTANYL CITRATE PF 50 MCG/ML IJ SOSY
50.0000 ug | PREFILLED_SYRINGE | Freq: Once | INTRAMUSCULAR | Status: AC
  Administered 2021-06-14: 50 ug via INTRAVENOUS
  Filled 2021-06-14: qty 1

## 2021-06-14 NOTE — ED Triage Notes (Signed)
Pt BIB [facility] from home. Pt tripped and fell down a flight of stairs, ~16 steps. Pt remembers fall, hit head multiple times. Pt endorses brief LOC during fall. Pt has a large lac to L forehead, bleeding controlled at this time. Pt endorses neck pain and head pain. C-collar in place per EMS. Pt has no neuro deficits. Pt had blurred vision and nausea with EMS, denies at this time, given 4mg  zofran by EMS. Pt is not on blood thinners. Ambulatory on scene. Bruising noted to R arm.   EMS VS- BP 160/palp, HR 96, SpO2 95%

## 2021-06-14 NOTE — Discharge Instructions (Signed)
You will need to have the stitches taken out in 5 days.  You can take a shower and they can get wet just do not scrub on them.

## 2021-06-14 NOTE — ED Notes (Signed)
Pt verbalized understanding of d/c instructions, meds and followup care. Denies questions. VSS, no distress noted. Steady gait to exit with all belongings.  ?

## 2021-06-14 NOTE — ED Provider Notes (Signed)
[facility] EMERGENCY DEPARTMENT Provider Note   CSN: [id number] Arrival date & time: 06/14/21  1547     History  Chief Complaint  Patient presents with   Joan Ochoa    Alliah Boulanger is a 51 y.o. female.  Patient is a 51 year old female with a history of hypertension and PTSD who is presenting today after a fall down the stairs.  She was carrying a handful of paper covers and dropped them and she slipped falling headfirst down a whole flight of wooden steps.  She hit her head multiple times and landed on her head at the bottom.  She thinks she did have a brief episode of loss of consciousness and since she has been awake her blurry vision has gradually improved.  She has had persistent nausea and headache.  She has not had any vomiting.  She denies any unilateral numbness or weakness.  She has normal sensation in her arms and legs.  She had no difficulty walking.  She has having pain in her neck and head but denies any pain anywhere else.  She has no chest pain, shortness of breath or abdominal pain.  Last tetanus shot was 5 years ago and she does not take any anticoagulation.   The history is provided by the patient.  Fall This is a new problem.      Home Medications Prior to Admission medications   Medication Sig Start Date End Date Taking? Authorizing Provider  Biotin 5000 MCG SUBL Place under the tongue.    [provider]  Cholecalciferol (VITAMIN D-1000 MAX ST) 25 MCG (1000 UT) tablet Take by mouth.    [provider]  clonazePAM (KLONOPIN) 0.25 MG disintegrating tablet Take by mouth.    [provider]  escitalopram (LEXAPRO) 20 MG tablet Take by mouth. 03/31/17   [provider]  fluticasone (FLONASE) 50 MCG/ACT nasal spray Place into the nose. 09/14/17 09/14/18  [provider]  losartan (COZAAR) 50 MG tablet  12/25/13   [provider]  mupirocin ointment (BACTROBAN) 2 % Apply 1 application topically daily.  06/02/20   Moye, [place], MD  omeprazole (PRILOSEC) 40 MG capsule Take by mouth. 07/26/17   [provider]  potassium gluconate 595 (99 K) MG TABS tablet Take by mouth.    [provider]  topiramate (TOPAMAX) 50 MG tablet  12/25/13   [provider]  traZODone (DESYREL) 100 MG tablet Take by mouth. 06/16/17   [provider]      Allergies    Codeine and Prednisone    Review of Systems   Review of Systems  Physical Exam Updated Vital Signs BP 129/84    Pulse 88    Temp 98.3 F (36.8 C) (Oral)    Resp 16    Ht 5\' 5"  (1.651 m)    Wt 78 kg    SpO2 95%    BMI 28.62 kg/m  Physical Exam Vitals and nursing note reviewed.  Constitutional:      General: She is in acute distress.     Appearance: She is well-developed.  HENT:     Head: Normocephalic and atraumatic.      Comments: 3 cm laceration to the left forehead Eyes:     Pupils: Pupils are equal, round, and reactive to light.  Cardiovascular:     Rate and Rhythm: Normal rate and regular rhythm.     Pulses: Normal pulses.     Heart sounds: Normal heart sounds. No murmur heard.  No friction rub.  Pulmonary:     Effort: Pulmonary effort is normal.     Breath sounds: Normal breath sounds. No wheezing or rales.  Abdominal:     General: Bowel sounds are normal. There is no distension.     Palpations: Abdomen is soft.     Tenderness: There is no abdominal tenderness. There is no guarding or rebound.  Musculoskeletal:        General: Normal range of motion.     Cervical back: Neck supple. Tenderness present. Spinous process tenderness and muscular tenderness present.     Comments: No edema.  No tenderness or swelling to bilateral shoulders, elbows, wrists, knees, hips and ankles.  Full range of motion with no pain.  Skin:    General: Skin is warm and dry.     Findings: No rash.  Neurological:     Mental Status: She is alert and oriented to person, place, and time. Mental status is at baseline.      Cranial Nerves: No cranial nerve deficit.     Sensory: No sensory deficit.     Motor: No weakness.  Psychiatric:        Mood and Affect: Mood normal.        Behavior: Behavior normal.    ED Results / Procedures / Treatments   Labs (all labs ordered are listed, but only abnormal results are displayed) Labs Reviewed - No data to display  EKG None  Radiology CT Head Wo Contrast  Result Date: 06/14/2021 CLINICAL DATA:  Trauma, fall EXAM: CT HEAD WITHOUT CONTRAST TECHNIQUE: Contiguous axial images were obtained from the base of the skull through the vertex without intravenous contrast. RADIATION DOSE REDUCTION: This exam was performed according to the departmental dose-optimization program which includes automated exposure control, adjustment of the mA and/or kV according to patient size and/or use of iterative reconstruction technique. COMPARISON:  None. FINDINGS: Brain: No acute intracranial findings are seen. Ventricles are not dilated. There is no shift of midline structures. There are no epidural or subdural fluid collections. Vascular: Unremarkable. Skull: No fracture is seen in the calvarium. There is subcutaneous contusion/hematoma in the right frontal scalp. Sinuses/Orbits: Unremarkable. Other: None IMPRESSION: No acute intracranial findings are seen in noncontrast CT brain. There is subcutaneous contusion/hematoma in the right frontal scalp. No fracture is seen in the calvarium. Electronically Signed   By: Elmer Picker M.D.   On: 06/14/2021 17:36   CT Cervical Spine Wo Contrast  Result Date: 06/14/2021 CLINICAL DATA:  Trauma, fall EXAM: CT CERVICAL SPINE WITHOUT CONTRAST TECHNIQUE: Multidetector CT imaging of the cervical spine was performed without intravenous contrast. Multiplanar CT image reconstructions were also generated. RADIATION DOSE REDUCTION: This exam was performed according to the departmental dose-optimization program which includes automated exposure control,  adjustment of the mA and/or kV according to patient size and/or use of iterative reconstruction technique. COMPARISON:  None. FINDINGS: Alignment: Alignment of posterior margins of vertebral bodies is unremarkable. Skull base and vertebrae: No recent fracture is seen. Small bony spurs are noted at multiple levels, more so at C5-C6 and C6-C7 levels. Soft tissues and spinal canal: There is no significant spinal stenosis. Prevertebral soft tissues are unremarkable. Disc levels: There is mild encroachment of neural foramina at C5-C6 and C6-C7 levels. Upper chest: Unremarkable. There is inhomogeneous attenuation in the thyroid with small nodules largest measuring 5 mm in size in the right lobe. Other: Unremarkable. IMPRESSION: No recent displaced fracture is seen in the cervical spine. Cervical spondylosis with  mild encroachment of neural foramina at C5-C6 and C6-C7 levels. There is inhomogeneous attenuation in thyroid. There is 5 mm low-density nodule in the right lobe of thyroid. Electronically Signed   By: Elmer Picker M.D.   On: 06/14/2021 17:44    Procedures Procedures   LACERATION REPAIR Performed by: [employer] Authorized by: Blanchie Dessert Consent: Verbal consent obtained. Risks and benefits: risks, benefits and alternatives were discussed Consent given by: patient Patient identity confirmed: provided demographic data Prepped and Draped in normal sterile fashion Wound explored  Laceration Location: left forehead  Laceration Length: 3cm  No Foreign Bodies seen or palpated  Anesthesia: local infiltration  Local anesthetic: lidocaine 2% with epinephrine  Anesthetic total: 4 ml  Irrigation method: syringe Amount of cleaning: standard  Skin closure: 6.0 prolene  Number of sutures: 8  Technique: simple interrupted  Patient tolerance: Patient tolerated the procedure well with no immediate complications.  Medications Ordered in ED Medications  lidocaine-EPINEPHrine  (XYLOCAINE W/EPI) 2 %-1:200000 (PF) injection 10 mL (has no administration in time range)    ED Course/ Medical Decision Making/ A&P                           Medical Decision Making Amount and/or Complexity of Data Reviewed External Data Reviewed: notes. Radiology: ordered and independent interpretation performed. Decision-making details documented in ED Course.  Risk OTC drugs. Prescription drug management.   Patient presenting today with a fall down the stairs and injury to the head.  She has head and neck pain but no other evidence of injury.  She is neurologically intact at this time and does not take any anticoagulation.  Tetanus shot is up-to-date.  Wound repaired as above.  Imaging is pending to evaluate for underlying injury.  Patient given pain and nausea control.  6:38 PM I independently interpreted patient's head CT which is negative for internal hemorrhage.  Per radiology external contusion but no intracranial bleed.  Cervical spine is negative for acute process.  Patient's cervical spine was cleared on exam.  Wound was repaired as above.  Patient does describe concussion type symptoms.  She was given percussion precautions.  She was given follow-up with concussion clinic and reasons to return.  Her husband was present throughout the interview and their questions were answered.  She has no social determinants affecting her care today.  She is stable for discharge and does not meet admission criteria.        Final Clinical Impression(s) / ED Diagnoses Final diagnoses:  Fall, initial encounter  Facial laceration, initial encounter  Concussion with unknown loss of consciousness status, initial encounter    Rx / DC Orders ED Discharge Orders          Ordered    ondansetron (ZOFRAN) 4 MG tablet  Every 6 hours        06/14/21 1837              Blanchie Dessert, MD 06/14/21 1839

## 2021-08-25 ENCOUNTER — Other Ambulatory Visit: Payer: Self-pay | Admitting: Student

## 2021-08-30 ENCOUNTER — Inpatient Hospital Stay
Admission: RE | Admit: 2021-08-30 | Discharge: 2021-08-30 | Disposition: A | Discharge status: Home / Self Care | Payer: Self-pay | Source: Ambulatory Visit | Attending: *Deleted | Admitting: *Deleted

## 2021-08-30 ENCOUNTER — Other Ambulatory Visit: Payer: Self-pay | Admitting: *Deleted

## 2021-08-30 DIAGNOSIS — Z1231 Encounter for screening mammogram for malignant neoplasm of breast: Secondary | ICD-10-CM

## 2021-09-01 ENCOUNTER — Other Ambulatory Visit: Payer: Self-pay | Admitting: Student

## 2021-09-01 DIAGNOSIS — N644 Mastodynia: Secondary | ICD-10-CM

## 2021-09-08 DIAGNOSIS — N644 Mastodynia: Secondary | ICD-10-CM | POA: Diagnosis not present

## 2021-09-14 ENCOUNTER — Inpatient Hospital Stay
Admission: RE | Admit: 2021-09-14 | Discharge: 2021-09-14 | Disposition: A | Discharge status: Home / Self Care | Payer: Self-pay | Source: Ambulatory Visit | Attending: *Deleted | Admitting: *Deleted

## 2021-09-14 ENCOUNTER — Other Ambulatory Visit: Payer: Self-pay | Admitting: *Deleted

## 2021-09-14 DIAGNOSIS — Z1231 Encounter for screening mammogram for malignant neoplasm of breast: Secondary | ICD-10-CM

## 2021-09-30 ENCOUNTER — Ambulatory Visit
Admission: RE | Admit: 2021-09-30 | Discharge: 2021-09-30 | Disposition: A | Discharge status: Home / Self Care | Payer: Medicare HMO | Source: Ambulatory Visit | Attending: Student | Admitting: Student

## 2021-09-30 DIAGNOSIS — N644 Mastodynia: Secondary | ICD-10-CM

## 2021-09-30 DIAGNOSIS — M79622 Pain in left upper arm: Secondary | ICD-10-CM | POA: Diagnosis not present

## 2021-11-17 DIAGNOSIS — M79641 Pain in right hand: Secondary | ICD-10-CM | POA: Diagnosis not present

## 2021-11-17 DIAGNOSIS — S40862A Insect bite (nonvenomous) of left upper arm, initial encounter: Secondary | ICD-10-CM | POA: Diagnosis not present

## 2021-11-17 DIAGNOSIS — M79642 Pain in left hand: Secondary | ICD-10-CM | POA: Diagnosis not present

## 2021-11-25 DIAGNOSIS — E119 Type 2 diabetes mellitus without complications: Secondary | ICD-10-CM | POA: Diagnosis not present

## 2021-11-25 DIAGNOSIS — E782 Mixed hyperlipidemia: Secondary | ICD-10-CM | POA: Diagnosis not present

## 2021-11-25 DIAGNOSIS — K219 Gastro-esophageal reflux disease without esophagitis: Secondary | ICD-10-CM | POA: Diagnosis not present

## 2021-11-25 DIAGNOSIS — M79641 Pain in right hand: Secondary | ICD-10-CM | POA: Diagnosis not present

## 2021-11-25 DIAGNOSIS — I10 Essential (primary) hypertension: Secondary | ICD-10-CM | POA: Diagnosis not present

## 2021-11-25 DIAGNOSIS — F339 Major depressive disorder, recurrent, unspecified: Secondary | ICD-10-CM | POA: Diagnosis not present

## 2021-11-25 DIAGNOSIS — Z0000 Encounter for general adult medical examination without abnormal findings: Secondary | ICD-10-CM | POA: Diagnosis not present

## 2021-11-25 DIAGNOSIS — F419 Anxiety disorder, unspecified: Secondary | ICD-10-CM | POA: Diagnosis not present

## 2021-11-25 DIAGNOSIS — F4310 Post-traumatic stress disorder, unspecified: Secondary | ICD-10-CM | POA: Diagnosis not present

## 2021-11-25 DIAGNOSIS — M79642 Pain in left hand: Secondary | ICD-10-CM | POA: Diagnosis not present

## 2022-02-11 DIAGNOSIS — E119 Type 2 diabetes mellitus without complications: Secondary | ICD-10-CM | POA: Diagnosis not present

## 2022-02-11 DIAGNOSIS — R1084 Generalized abdominal pain: Secondary | ICD-10-CM | POA: Diagnosis not present

## 2022-02-11 DIAGNOSIS — I10 Essential (primary) hypertension: Secondary | ICD-10-CM | POA: Diagnosis not present

## 2022-02-11 DIAGNOSIS — J0100 Acute maxillary sinusitis, unspecified: Secondary | ICD-10-CM | POA: Diagnosis not present

## 2022-03-23 DIAGNOSIS — F331 Major depressive disorder, recurrent, moderate: Secondary | ICD-10-CM | POA: Diagnosis not present

## 2022-03-23 DIAGNOSIS — F4312 Post-traumatic stress disorder, chronic: Secondary | ICD-10-CM | POA: Diagnosis not present

## 2022-03-23 DIAGNOSIS — F411 Generalized anxiety disorder: Secondary | ICD-10-CM | POA: Diagnosis not present

## 2022-04-19 DIAGNOSIS — H5213 Myopia, bilateral: Secondary | ICD-10-CM | POA: Diagnosis not present

## 2022-04-19 DIAGNOSIS — E119 Type 2 diabetes mellitus without complications: Secondary | ICD-10-CM | POA: Diagnosis not present

## 2022-05-24 DIAGNOSIS — Z0100 Encounter for examination of eyes and vision without abnormal findings: Secondary | ICD-10-CM | POA: Diagnosis not present

## 2022-06-20 DIAGNOSIS — F331 Major depressive disorder, recurrent, moderate: Secondary | ICD-10-CM | POA: Diagnosis not present

## 2022-06-20 DIAGNOSIS — F411 Generalized anxiety disorder: Secondary | ICD-10-CM | POA: Diagnosis not present

## 2022-06-20 DIAGNOSIS — F4312 Post-traumatic stress disorder, chronic: Secondary | ICD-10-CM | POA: Diagnosis not present

## 2022-07-07 DIAGNOSIS — J321 Chronic frontal sinusitis: Secondary | ICD-10-CM | POA: Diagnosis not present

## 2022-07-07 DIAGNOSIS — R509 Fever, unspecified: Secondary | ICD-10-CM | POA: Diagnosis not present

## 2022-07-19 DIAGNOSIS — F4311 Post-traumatic stress disorder, acute: Secondary | ICD-10-CM | POA: Diagnosis not present

## 2022-07-19 DIAGNOSIS — F332 Major depressive disorder, recurrent severe without psychotic features: Secondary | ICD-10-CM | POA: Diagnosis not present

## 2022-07-27 DIAGNOSIS — Z780 Asymptomatic menopausal state: Secondary | ICD-10-CM | POA: Diagnosis not present

## 2022-07-27 DIAGNOSIS — F419 Anxiety disorder, unspecified: Secondary | ICD-10-CM | POA: Diagnosis not present

## 2022-07-27 DIAGNOSIS — Z79899 Other long term (current) drug therapy: Secondary | ICD-10-CM | POA: Diagnosis not present

## 2022-07-27 DIAGNOSIS — K219 Gastro-esophageal reflux disease without esophagitis: Secondary | ICD-10-CM | POA: Diagnosis not present

## 2022-07-27 DIAGNOSIS — I10 Essential (primary) hypertension: Secondary | ICD-10-CM | POA: Diagnosis not present

## 2022-07-27 DIAGNOSIS — E119 Type 2 diabetes mellitus without complications: Secondary | ICD-10-CM | POA: Diagnosis not present

## 2022-07-27 DIAGNOSIS — F4310 Post-traumatic stress disorder, unspecified: Secondary | ICD-10-CM | POA: Diagnosis not present

## 2022-08-02 DIAGNOSIS — F332 Major depressive disorder, recurrent severe without psychotic features: Secondary | ICD-10-CM | POA: Diagnosis not present

## 2022-08-02 DIAGNOSIS — F4311 Post-traumatic stress disorder, acute: Secondary | ICD-10-CM | POA: Diagnosis not present

## 2022-08-16 DIAGNOSIS — F332 Major depressive disorder, recurrent severe without psychotic features: Secondary | ICD-10-CM | POA: Diagnosis not present

## 2022-08-16 DIAGNOSIS — F4311 Post-traumatic stress disorder, acute: Secondary | ICD-10-CM | POA: Diagnosis not present

## 2022-09-05 DIAGNOSIS — M7652 Patellar tendinitis, left knee: Secondary | ICD-10-CM | POA: Diagnosis not present

## 2022-09-05 DIAGNOSIS — M1712 Unilateral primary osteoarthritis, left knee: Secondary | ICD-10-CM | POA: Diagnosis not present

## 2022-09-16 DIAGNOSIS — F411 Generalized anxiety disorder: Secondary | ICD-10-CM | POA: Diagnosis not present

## 2022-09-16 DIAGNOSIS — F4312 Post-traumatic stress disorder, chronic: Secondary | ICD-10-CM | POA: Diagnosis not present

## 2022-09-16 DIAGNOSIS — F331 Major depressive disorder, recurrent, moderate: Secondary | ICD-10-CM | POA: Diagnosis not present

## 2022-09-27 DIAGNOSIS — F332 Major depressive disorder, recurrent severe without psychotic features: Secondary | ICD-10-CM | POA: Diagnosis not present

## 2022-09-27 DIAGNOSIS — F4311 Post-traumatic stress disorder, acute: Secondary | ICD-10-CM | POA: Diagnosis not present

## 2022-10-17 DIAGNOSIS — R0683 Snoring: Secondary | ICD-10-CM | POA: Diagnosis not present

## 2022-10-17 DIAGNOSIS — G43719 Chronic migraine without aura, intractable, without status migrainosus: Secondary | ICD-10-CM | POA: Diagnosis not present

## 2022-10-17 DIAGNOSIS — H93299 Other abnormal auditory perceptions, unspecified ear: Secondary | ICD-10-CM | POA: Diagnosis not present

## 2022-10-18 DIAGNOSIS — F332 Major depressive disorder, recurrent severe without psychotic features: Secondary | ICD-10-CM | POA: Diagnosis not present

## 2022-10-18 DIAGNOSIS — F4311 Post-traumatic stress disorder, acute: Secondary | ICD-10-CM | POA: Diagnosis not present

## 2022-12-13 DIAGNOSIS — F332 Major depressive disorder, recurrent severe without psychotic features: Secondary | ICD-10-CM | POA: Diagnosis not present

## 2022-12-13 DIAGNOSIS — F4311 Post-traumatic stress disorder, acute: Secondary | ICD-10-CM | POA: Diagnosis not present

## 2022-12-16 DIAGNOSIS — F4312 Post-traumatic stress disorder, chronic: Secondary | ICD-10-CM | POA: Diagnosis not present

## 2022-12-16 DIAGNOSIS — F331 Major depressive disorder, recurrent, moderate: Secondary | ICD-10-CM | POA: Diagnosis not present

## 2022-12-16 DIAGNOSIS — F411 Generalized anxiety disorder: Secondary | ICD-10-CM | POA: Diagnosis not present

## 2022-12-17 DIAGNOSIS — G4733 Obstructive sleep apnea (adult) (pediatric): Secondary | ICD-10-CM | POA: Diagnosis not present

## 2022-12-26 DIAGNOSIS — H93299 Other abnormal auditory perceptions, unspecified ear: Secondary | ICD-10-CM | POA: Diagnosis not present

## 2022-12-26 DIAGNOSIS — F331 Major depressive disorder, recurrent, moderate: Secondary | ICD-10-CM | POA: Diagnosis not present

## 2022-12-26 DIAGNOSIS — R0683 Snoring: Secondary | ICD-10-CM | POA: Diagnosis not present

## 2022-12-26 DIAGNOSIS — F4312 Post-traumatic stress disorder, chronic: Secondary | ICD-10-CM | POA: Diagnosis not present

## 2022-12-26 DIAGNOSIS — F32A Depression, unspecified: Secondary | ICD-10-CM | POA: Diagnosis not present

## 2022-12-26 DIAGNOSIS — F411 Generalized anxiety disorder: Secondary | ICD-10-CM | POA: Diagnosis not present

## 2022-12-26 DIAGNOSIS — G43719 Chronic migraine without aura, intractable, without status migrainosus: Secondary | ICD-10-CM | POA: Diagnosis not present

## 2023-01-18 DIAGNOSIS — F331 Major depressive disorder, recurrent, moderate: Secondary | ICD-10-CM | POA: Diagnosis not present

## 2023-01-18 DIAGNOSIS — F411 Generalized anxiety disorder: Secondary | ICD-10-CM | POA: Diagnosis not present

## 2023-01-18 DIAGNOSIS — F4312 Post-traumatic stress disorder, chronic: Secondary | ICD-10-CM | POA: Diagnosis not present

## 2023-02-15 DIAGNOSIS — D2261 Melanocytic nevi of right upper limb, including shoulder: Secondary | ICD-10-CM | POA: Diagnosis not present

## 2023-02-15 DIAGNOSIS — R208 Other disturbances of skin sensation: Secondary | ICD-10-CM | POA: Diagnosis not present

## 2023-02-15 DIAGNOSIS — D0359 Melanoma in situ of other part of trunk: Secondary | ICD-10-CM | POA: Diagnosis not present

## 2023-02-15 DIAGNOSIS — D225 Melanocytic nevi of trunk: Secondary | ICD-10-CM | POA: Diagnosis not present

## 2023-02-15 DIAGNOSIS — D485 Neoplasm of uncertain behavior of skin: Secondary | ICD-10-CM | POA: Diagnosis not present

## 2023-02-15 DIAGNOSIS — L820 Inflamed seborrheic keratosis: Secondary | ICD-10-CM | POA: Diagnosis not present

## 2023-02-15 DIAGNOSIS — D2262 Melanocytic nevi of left upper limb, including shoulder: Secondary | ICD-10-CM | POA: Diagnosis not present

## 2023-02-15 DIAGNOSIS — D2271 Melanocytic nevi of right lower limb, including hip: Secondary | ICD-10-CM | POA: Diagnosis not present

## 2023-02-15 DIAGNOSIS — L538 Other specified erythematous conditions: Secondary | ICD-10-CM | POA: Diagnosis not present

## 2023-02-15 DIAGNOSIS — D2272 Melanocytic nevi of left lower limb, including hip: Secondary | ICD-10-CM | POA: Diagnosis not present

## 2023-02-17 DIAGNOSIS — F32A Depression, unspecified: Secondary | ICD-10-CM | POA: Diagnosis not present

## 2023-02-17 DIAGNOSIS — G4733 Obstructive sleep apnea (adult) (pediatric): Secondary | ICD-10-CM | POA: Diagnosis not present

## 2023-02-17 DIAGNOSIS — G43719 Chronic migraine without aura, intractable, without status migrainosus: Secondary | ICD-10-CM | POA: Diagnosis not present

## 2023-03-06 DIAGNOSIS — J0100 Acute maxillary sinusitis, unspecified: Secondary | ICD-10-CM | POA: Diagnosis not present

## 2023-03-06 DIAGNOSIS — H6501 Acute serous otitis media, right ear: Secondary | ICD-10-CM | POA: Diagnosis not present

## 2023-03-16 DIAGNOSIS — D0359 Melanoma in situ of other part of trunk: Secondary | ICD-10-CM | POA: Diagnosis not present

## 2023-04-03 DIAGNOSIS — G4733 Obstructive sleep apnea (adult) (pediatric): Secondary | ICD-10-CM | POA: Diagnosis not present

## 2023-04-03 DIAGNOSIS — G43719 Chronic migraine without aura, intractable, without status migrainosus: Secondary | ICD-10-CM | POA: Diagnosis not present

## 2023-04-03 DIAGNOSIS — F32A Depression, unspecified: Secondary | ICD-10-CM | POA: Diagnosis not present

## 2023-04-17 DIAGNOSIS — G4733 Obstructive sleep apnea (adult) (pediatric): Secondary | ICD-10-CM | POA: Diagnosis not present

## 2023-04-17 DIAGNOSIS — F331 Major depressive disorder, recurrent, moderate: Secondary | ICD-10-CM | POA: Diagnosis not present

## 2023-04-17 DIAGNOSIS — F411 Generalized anxiety disorder: Secondary | ICD-10-CM | POA: Diagnosis not present

## 2023-04-17 DIAGNOSIS — F4312 Post-traumatic stress disorder, chronic: Secondary | ICD-10-CM | POA: Diagnosis not present

## 2023-05-18 DIAGNOSIS — G4733 Obstructive sleep apnea (adult) (pediatric): Secondary | ICD-10-CM | POA: Diagnosis not present

## 2023-06-01 DIAGNOSIS — R059 Cough, unspecified: Secondary | ICD-10-CM | POA: Diagnosis not present

## 2023-06-01 DIAGNOSIS — J209 Acute bronchitis, unspecified: Secondary | ICD-10-CM | POA: Diagnosis not present

## 2023-06-08 ENCOUNTER — Ambulatory Visit: Payer: Medicare HMO | Admitting: Family Medicine

## 2023-06-14 DIAGNOSIS — L820 Inflamed seborrheic keratosis: Secondary | ICD-10-CM | POA: Diagnosis not present

## 2023-06-14 DIAGNOSIS — Z86006 Personal history of melanoma in-situ: Secondary | ICD-10-CM | POA: Diagnosis not present

## 2023-06-14 DIAGNOSIS — D2262 Melanocytic nevi of left upper limb, including shoulder: Secondary | ICD-10-CM | POA: Diagnosis not present

## 2023-06-14 DIAGNOSIS — D485 Neoplasm of uncertain behavior of skin: Secondary | ICD-10-CM | POA: Diagnosis not present

## 2023-06-14 DIAGNOSIS — D2272 Melanocytic nevi of left lower limb, including hip: Secondary | ICD-10-CM | POA: Diagnosis not present

## 2023-06-14 DIAGNOSIS — D225 Melanocytic nevi of trunk: Secondary | ICD-10-CM | POA: Diagnosis not present

## 2023-06-14 DIAGNOSIS — D2261 Melanocytic nevi of right upper limb, including shoulder: Secondary | ICD-10-CM | POA: Diagnosis not present

## 2023-06-15 ENCOUNTER — Ambulatory Visit: Payer: Medicare HMO | Admitting: Family Medicine

## 2023-06-15 ENCOUNTER — Encounter: Payer: Self-pay | Admitting: Family Medicine

## 2023-06-15 VITALS — BP 128/72 | HR 84 | Temp 97.7°F | Ht 63.5 in | Wt 179.2 lb

## 2023-06-15 DIAGNOSIS — F4310 Post-traumatic stress disorder, unspecified: Secondary | ICD-10-CM

## 2023-06-15 DIAGNOSIS — Z1159 Encounter for screening for other viral diseases: Secondary | ICD-10-CM | POA: Insufficient documentation

## 2023-06-15 DIAGNOSIS — E782 Mixed hyperlipidemia: Secondary | ICD-10-CM

## 2023-06-15 DIAGNOSIS — Z23 Encounter for immunization: Secondary | ICD-10-CM

## 2023-06-15 DIAGNOSIS — F32A Depression, unspecified: Secondary | ICD-10-CM

## 2023-06-15 DIAGNOSIS — F419 Anxiety disorder, unspecified: Secondary | ICD-10-CM | POA: Diagnosis not present

## 2023-06-15 DIAGNOSIS — G43009 Migraine without aura, not intractable, without status migrainosus: Secondary | ICD-10-CM

## 2023-06-15 DIAGNOSIS — Z114 Encounter for screening for human immunodeficiency virus [HIV]: Secondary | ICD-10-CM | POA: Diagnosis not present

## 2023-06-15 DIAGNOSIS — Z79899 Other long term (current) drug therapy: Secondary | ICD-10-CM | POA: Insufficient documentation

## 2023-06-15 DIAGNOSIS — E559 Vitamin D deficiency, unspecified: Secondary | ICD-10-CM | POA: Insufficient documentation

## 2023-06-15 DIAGNOSIS — K219 Gastro-esophageal reflux disease without esophagitis: Secondary | ICD-10-CM

## 2023-06-15 DIAGNOSIS — Z8639 Personal history of other endocrine, nutritional and metabolic disease: Secondary | ICD-10-CM | POA: Insufficient documentation

## 2023-06-15 DIAGNOSIS — I10 Essential (primary) hypertension: Secondary | ICD-10-CM

## 2023-06-15 DIAGNOSIS — Z1211 Encounter for screening for malignant neoplasm of colon: Secondary | ICD-10-CM | POA: Insufficient documentation

## 2023-06-15 DIAGNOSIS — Z85820 Personal history of malignant melanoma of skin: Secondary | ICD-10-CM | POA: Insufficient documentation

## 2023-06-15 DIAGNOSIS — R7303 Prediabetes: Secondary | ICD-10-CM

## 2023-06-15 LAB — COMPREHENSIVE METABOLIC PANEL
ALT: 65 U/L — ABNORMAL HIGH (ref 0–35)
AST: 31 U/L (ref 0–37)
Albumin: 4.6 g/dL (ref 3.5–5.2)
Alkaline Phosphatase: 85 U/L (ref 39–117)
BUN: 11 mg/dL (ref 6–23)
CO2: 27 meq/L (ref 19–32)
Calcium: 9.4 mg/dL (ref 8.4–10.5)
Chloride: 102 meq/L (ref 96–112)
Creatinine, Ser: 0.83 mg/dL (ref 0.40–1.20)
GFR: 80.33 mL/min (ref >60.00)
Glucose, Bld: 141 mg/dL — ABNORMAL HIGH (ref 70–99)
Potassium: 4.6 meq/L (ref 3.5–5.1)
Sodium: 139 meq/L (ref 135–145)
Total Bilirubin: 0.4 mg/dL (ref 0.2–1.2)
Total Protein: 7 g/dL (ref 6.0–8.3)

## 2023-06-15 LAB — CBC WITH DIFFERENTIAL/PLATELET
Basophils Absolute: 0.1 10*3/uL (ref 0.0–0.1)
Basophils Relative: 1.8 % (ref 0.0–3.0)
Eosinophils Absolute: 0.2 10*3/uL (ref 0.0–0.7)
Eosinophils Relative: 4.2 % (ref 0.0–5.0)
HCT: 44.6 % (ref 36.0–46.0)
Hemoglobin: 14.8 g/dL (ref 12.0–15.0)
Lymphocytes Relative: 38.4 % (ref 12.0–46.0)
Lymphs Abs: 2.2 10*3/uL (ref 0.7–4.0)
MCHC: 33.3 g/dL (ref 30.0–36.0)
MCV: 86.8 fL (ref 78.0–100.0)
Monocytes Absolute: 0.4 10*3/uL (ref 0.1–1.0)
Monocytes Relative: 6.3 % (ref 3.0–12.0)
Neutro Abs: 2.9 10*3/uL (ref 1.4–7.7)
Neutrophils Relative %: 49.3 % (ref 43.0–77.0)
Platelets: 364 10*3/uL (ref 150.0–400.0)
RBC: 5.14 Mil/uL — ABNORMAL HIGH (ref 3.87–5.11)
RDW: 13.4 % (ref 11.5–15.5)
WBC: 5.8 10*3/uL (ref 4.0–10.5)

## 2023-06-15 LAB — LIPID PANEL
Cholesterol: 220 mg/dL — ABNORMAL HIGH (ref 0–200)
HDL: 54.3 mg/dL (ref >39.00)
LDL Cholesterol: 120 mg/dL — ABNORMAL HIGH (ref 0–99)
NonHDL: 165.53
Total CHOL/HDL Ratio: 4
Triglycerides: 226 mg/dL — ABNORMAL HIGH (ref 0.0–149.0)
VLDL: 45.2 mg/dL — ABNORMAL HIGH (ref 0.0–40.0)

## 2023-06-15 LAB — TSH: TSH: 1.28 u[IU]/mL (ref 0.35–5.50)

## 2023-06-15 LAB — HEMOGLOBIN A1C: Hgb A1c MFr Bld: 7.4 % — ABNORMAL HIGH (ref 4.6–6.5)

## 2023-06-15 LAB — VITAMIN D 25 HYDROXY (VIT D DEFICIENCY, FRACTURES): VITD: 63.85 ng/mL (ref 30.00–100.00)

## 2023-06-15 LAB — VITAMIN B12: Vitamin B-12: 838 pg/mL (ref 211–911)

## 2023-06-15 MED ORDER — OMEPRAZOLE 40 MG PO CPDR: 40.0000 mg | DELAYED_RELEASE_CAPSULE | Freq: Every day | ORAL | 1 refills | Status: DC | PRN

## 2023-06-15 NOTE — Assessment & Plan Note (Signed)
HIV screen today Low risk

## 2023-06-15 NOTE — Assessment & Plan Note (Signed)
Sent for last colonoscopy report  Per pt is utd  Will discuss again at annual exam

## 2023-06-15 NOTE — Assessment & Plan Note (Signed)
Disc goals for lipids and reasons to control them Rev last labs from [facility] Rev low sat fat diet in detail

## 2023-06-15 NOTE — Assessment & Plan Note (Signed)
Bmet today  ? Cause

## 2023-06-15 NOTE — Assessment & Plan Note (Signed)
With GAD and PTSD Sees psychiatry See a/p of anxiety

## 2023-06-15 NOTE — Assessment & Plan Note (Signed)
Utd derm care  Times 2 No mets   Uses sun protection

## 2023-06-15 NOTE — Assessment & Plan Note (Signed)
bp in fair control at this time  BP Readings from Last 1 Encounters:  06/15/23 128/72   No changes needed Most recent labs reviewed  Disc lifstyle change with low sodium diet and exercise  Taking losartan 50 mg daily  Tolerates well Lab today  Reviewed last lab from [facility] as well

## 2023-06-15 NOTE — Assessment & Plan Note (Signed)
Takes prn omeprazole 40 On avg every other day Encouraged to avoid triggers

## 2023-06-15 NOTE — Progress Notes (Signed)
Subjective:    Patient ID: Joan Ochoa, female    DOB: [birth year], 53 y.o.   MRN: [id number]  HPI  Wt Readings from Last 3 Encounters:  06/15/23 179 lb 4 oz (81.3 kg)  06/14/21 172 lb (78 kg)  06/27/15 170 lb (77.1 kg)   31.25 kg/m  Vitals:   06/15/23 1018  BP: 128/72  Pulse: 84  Temp: 97.7 F (36.5 C)  SpO2: 97%   53 yo female pt presents to establish as a new pt   Dr Terance Hart in [facility] was last pcp but only saw once Prior to that PA [employer] here from [facility] in 2021  She grew up here  Living with mother on childhood property  Her father died   Mother is turning 42  She has vestibular problems  Needs a lot of assistance  Father died from lung cancer , he had history of CAD  Was a smoker     Getting over bronchitis and the flu (flu early jan)  Feeling better   Has had a personal trainer in past for exercise  Still exercising    Chronic migraine Sees Dr Sherryll Burger at [facility] Lexapro was causing her headaches and change to pristiq helped a lot    HTN bp is stable today  No cp or palpitations or headaches or edema  No side effects to medicines  BP Readings from Last 3 Encounters:  06/15/23 128/72  06/14/21 (!) 142/80  06/27/15 (!) 114/94    Losartan 50 mg  Last GFR 89 at [facility] 07/2022   GERD Omeprazole 40 mg prn-= averages every other day  Last b12 a year ago 712   D def  Takes 5000 international units two of them once weekly  Years ago had low level    History of anxious and depressed mood and PTSD Was on disability for PTSD Sees her psychiatrist in [facility] every 3 months     06/15/2023   11:06 AM  Depression screen PHQ 2/9  Decreased Interest 1  Down, Depressed, Hopeless 1  PHQ - 2 Score 2  Altered sleeping 1  Tired, decreased energy 1  Change in appetite 1  Feeling bad or failure about yourself  1  Trouble concentrating 1  Moving slowly or fidgety/restless 0  Suicidal thoughts 0  PHQ-9 Score 7  Difficult doing work/chores Somewhat difficult       06/15/2023   11:06 AM  GAD 7 : Generalized Anxiety Score  Nervous, Anxious, on Edge 1  Control/stop worrying 2  Worry too much - different things 2  Trouble relaxing 1  Restless 0  Easily annoyed or irritable 1  Afraid - awful might happen 1  Total GAD 7 Score 8  Anxiety Difficulty Somewhat difficult    Mental health meds Pristiq 50 mg daily  Clonazepam-past -last refill was 09/2022 from Dr Sherrell Puller in [facility] Does not use regularly   Has a counselor also - sees her on and off  Sees her less and less frequently now     Hyperlipidemia Last LDL of 129 in 11/2021   Prediabetes  Had A1c 6.5 07/2022   Sleep apnea  Skin care/derm/skin cancer  Just took off melanoma (times 2)  from her back - was mild / not met  Has some biopsies for other things pending  Does wear skin protection  [place] derm    Past hysterectomy - endometrial problem and fibroid  Past appy and ccy  Also sinus surgery  Fam history Father- cancer lung   Depression in sibs Adhd daughter    Patient Active Problem List   Diagnosis Date Noted   Current use of proton pump inhibitor 06/15/2023   H/O malignant melanoma of back 06/15/2023   Encounter for screening for HIV 06/15/2023   Encounter for hepatitis C screening test for low risk patient 06/15/2023   Vitamin D deficiency 06/15/2023   History of hypokalemia 06/15/2023   Colon cancer screening 06/15/2023   Anxiety and depression 08/22/2017   Depression 08/22/2017   Hyperlipidemia, mixed 08/22/2017   Migraine without aura and without status migrainosus, not intractable 08/22/2017   Prediabetes 08/22/2017   PTSD (post-traumatic stress disorder) 08/22/2017   Essential hypertension 08/18/2017   GERD without esophagitis 08/18/2017   Past Medical History:  Diagnosis Date   Allergy    Anxiety    Most of my life as adult   Depression 05/13/2014   GERD (gastroesophageal reflux disease)    History of dysplastic nevus 06/02/2020    MODERATE TO SEVERELY ATYPICAL MOLE at left upper back medial/excision    Hypertension 2009   Sleep apnea 2024   Have cpap   Past Surgical History:  Procedure Laterality Date   ABDOMINAL HYSTERECTOMY  2008   APPENDECTOMY  1990   CHOLECYSTECTOMY  2013   NASAL SINUS SURGERY     Social History   Tobacco Use   Smoking status: Never   Smokeless tobacco: Never  Substance Use Topics   Alcohol use: No   Drug use: Never   Family History  Problem Relation Age of Onset   Arthritis Mother    Depression Mother    Diabetes Mother    Hearing loss Mother    Hypertension Mother    Varicose Veins Mother    Cancer Father    COPD Father    Stroke Father    Early death Maternal Grandmother    ADD / ADHD Daughter    Depression Sister    Diabetes Sister    Hypertension Sister    ADD / ADHD Daughter    Depression Sister    Diabetes Sister    Hypertension Sister    Allergies  Allergen Reactions   Codeine Nausea And Vomiting   Prednisone Other (See Comments)    Hallucinations , body turns real red ,hot and shortness of breath    Current Outpatient Medications on File Prior to Visit  Medication Sig Dispense Refill   albuterol (VENTOLIN HFA) 108 (90 Base) MCG/ACT inhaler Inhale 1-2 puffs into the lungs every 6 (six) hours as needed.     Biotin 5000 MCG SUBL Place under the tongue.     Cholecalciferol (VITAMIN D-1000 MAX ST) 25 MCG (1000 UT) tablet Take by mouth.     clonazePAM (KLONOPIN) 0.25 MG disintegrating tablet Take 0.25 mg by mouth daily as needed.     desvenlafaxine (PRISTIQ) 50 MG 24 hr tablet Take 50 mg by mouth daily.     fluticasone (FLONASE) 50 MCG/ACT nasal spray Place into the nose.     losartan (COZAAR) 50 MG tablet      potassium gluconate 595 (99 K) MG TABS tablet Take by mouth.     traZODone (DESYREL) 100 MG tablet Take 100 mg by mouth at bedtime as needed for sleep.     No current facility-administered medications on file prior to visit.    Review of Systems   Constitutional:  Positive for fatigue. Negative for activity change, appetite change, fever and unexpected weight change.  HENT:  Negative for congestion, ear pain, rhinorrhea, sinus pressure and sore throat.   Eyes:  Negative for pain, redness and visual disturbance.  Respiratory:  Negative for cough, shortness of breath and wheezing.   Cardiovascular:  Negative for chest pain and palpitations.  Gastrointestinal:  Negative for abdominal pain, blood in stool, constipation and diarrhea.  Endocrine: Negative for polydipsia and polyuria.  Genitourinary:  Negative for dysuria, frequency and urgency.  Musculoskeletal:  Negative for arthralgias, back pain and myalgias.  Skin:  Negative for pallor and rash.  Allergic/Immunologic: Negative for environmental allergies.  Neurological:  Negative for dizziness, syncope and headaches.  Hematological:  Negative for adenopathy. Does not bruise/bleed easily.  Psychiatric/Behavioral:  Positive for dysphoric mood and sleep disturbance. Negative for decreased concentration. The patient is nervous/anxious.        Objective:   Physical Exam Constitutional:      General: She is not in acute distress.    Appearance: Normal appearance. She is well-developed. She is obese. She is not ill-appearing or diaphoretic.  HENT:     Head: Normocephalic and atraumatic.  Eyes:     Conjunctiva/sclera: Conjunctivae normal.     Pupils: Pupils are equal, round, and reactive to light.  Neck:     Thyroid: No thyromegaly.     Vascular: No carotid bruit or JVD.  Cardiovascular:     Rate and Rhythm: Normal rate and regular rhythm.     Heart sounds: Normal heart sounds.     No gallop.  Pulmonary:     Effort: Pulmonary effort is normal. No respiratory distress.     Breath sounds: Normal breath sounds. No wheezing or rales.  Abdominal:     General: There is no distension or abdominal bruit.     Palpations: Abdomen is soft.  Musculoskeletal:     Cervical back: Normal  range of motion and neck supple.     Right lower leg: No edema.     Left lower leg: No edema.  Lymphadenopathy:     Cervical: No cervical adenopathy.  Skin:    General: Skin is warm and dry.     Coloration: Skin is not pale.     Findings: No rash.  Neurological:     Mental Status: She is alert.     Cranial Nerves: No cranial nerve deficit.     Coordination: Coordination normal.     Deep Tendon Reflexes: Reflexes are normal and symmetric. Reflexes normal.  Psychiatric:        Mood and Affect: Mood normal.        Cognition and Memory: Cognition and memory normal.     Comments: Candidly discusses symptoms and stressors   Pleasant            Assessment & Plan:   Problem List Items Addressed This Visit       Cardiovascular and Mediastinum   Migraine without aura and without status migrainosus, not intractable   Now controlled Sees neuro Dr Sherryll Burger In past ssri made this worse  No on snri-much improved   Encouraged good lifestyle habits for headache       Relevant Medications   desvenlafaxine (PRISTIQ) 50 MG 24 hr tablet   traZODone (DESYREL) 100 MG tablet   Essential hypertension - Primary   bp in fair control at this time  BP Readings from Last 1 Encounters:  06/15/23 128/72   No changes needed Most recent labs reviewed  Disc lifstyle change with low sodium diet and exercise  Taking  losartan 50 mg daily  Tolerates well Lab today  Reviewed last lab from [facility] clinic as well      Relevant Orders   TSH   Lipid panel   Comprehensive metabolic panel   CBC with Differential/Platelet     Digestive   GERD without esophagitis   Takes prn omeprazole 40 On avg every other day Encouraged to avoid triggers      Relevant Medications   omeprazole (PRILOSEC) 40 MG capsule     Other   Vitamin D deficiency   D added to labs  Pt takes 10, 000 international units weekly  On ppi Discussed importance to bone and overall health       Relevant Orders   VITAMIN D  25 Hydroxy (Vit-D Deficiency, Fractures)   PTSD (post-traumatic stress disorder)   See a/p for anxiety  Psychiatric care  Snri  Trazodone for sleep 100 mg  Counseling Doing fairly well currently       Relevant Medications   desvenlafaxine (PRISTIQ) 50 MG 24 hr tablet   traZODone (DESYREL) 100 MG tablet   Prediabetes   A1c today   Was 6.5 at [facility] last spring disc imp of low glycemic diet and wt loss to prevent DM2       Relevant Orders   Hemoglobin A1c   Hyperlipidemia, mixed   Disc goals for lipids and reasons to control them Rev last labs from [facility] Rev low sat fat diet in detail         Relevant Orders   Lipid panel   Comprehensive metabolic panel   History of hypokalemia   Bmet today  ? Cause       H/O malignant melanoma of back   Utd derm care  Times 2 No mets   Uses sun protection       Encounter for screening for HIV   HIV screen today Low risk      Relevant Orders   HIV Antibody (routine testing w rflx)   Encounter for hepatitis C screening test for low risk patient   Hep c and HIV screen with lab today Low risk      Relevant Orders   Hepatitis C Antibody   Depression   With GAD and PTSD Sees psychiatry See a/p of anxiety      Relevant Medications   desvenlafaxine (PRISTIQ) 50 MG 24 hr tablet   traZODone (DESYREL) 100 MG tablet   Current use of proton pump inhibitor   Takes omeprazole 40 mg daily prn (on avg every other day) Encouraged to avoid triggering foods   B12 and D added to labs today  Will need to watch bone density      Relevant Orders   VITAMIN D 25 Hydroxy (Vit-D Deficiency, Fractures)   Vitamin B12   Colon cancer screening   Sent for last colonoscopy report  Per pt is utd  Will discuss again at annual exam      Anxiety and depression   Pt has what sounds like GAD and PTSD Sees psychiatry (virtual/ongoing) in [place] and overall stable PHQ 7 and GAD score 8  Taking pristiq 50 mg daily and clonazepam prn 0.25 mg   Trazodone 100 mg prn sleep  Has counselor  Good insight  Thinks this combo is working fairly well for her       Relevant Medications   desvenlafaxine (PRISTIQ) 50 MG 24 hr tablet   traZODone (DESYREL) 100 MG tablet   Other Visit Diagnoses  Need for influenza vaccination       Relevant Orders   Flu vaccine trivalent PF, 6mos and older(Flulaval,Afluria,Fluarix,Fluzone) (Completed)

## 2023-06-15 NOTE — Assessment & Plan Note (Signed)
A1c today   Was 6.5 at [facility] last spring disc imp of low glycemic diet and wt loss to prevent DM2

## 2023-06-15 NOTE — Assessment & Plan Note (Signed)
Now controlled Sees neuro Dr Sherryll Burger In past ssri made this worse  No on snri-much improved   Encouraged good lifestyle habits for headache

## 2023-06-15 NOTE — Assessment & Plan Note (Signed)
Hep c and HIV screen with lab today Low risk

## 2023-06-15 NOTE — Assessment & Plan Note (Signed)
Takes omeprazole 40 mg daily prn (on avg every other day) Encouraged to avoid triggering foods   B12 and D added to labs today  Will need to watch bone density

## 2023-06-15 NOTE — Assessment & Plan Note (Addendum)
Pt has what sounds like GAD and PTSD Sees psychiatry (virtual/ongoing) in [place] and overall stable PHQ 7 and GAD score 8  Taking pristiq 50 mg daily and clonazepam prn 0.25 mg  Trazodone 100 mg prn sleep  Has counselor  Good insight  Thinks this combo is working fairly well for her

## 2023-06-15 NOTE — Assessment & Plan Note (Signed)
D added to labs  Pt takes 10, 000 international units weekly  On ppi Discussed importance to bone and overall health

## 2023-06-15 NOTE — Patient Instructions (Addendum)
Flu shot today   Labs today    Schedule annual exam at check out   Take care of yourself

## 2023-06-15 NOTE — Assessment & Plan Note (Signed)
See a/p for anxiety  Psychiatric care  Snri  Trazodone for sleep 100 mg  Counseling Doing fairly well currently

## 2023-06-16 ENCOUNTER — Encounter: Payer: Self-pay | Admitting: *Deleted

## 2023-06-16 LAB — HIV ANTIBODY (ROUTINE TESTING W REFLEX): HIV 1&2 Ab, 4th Generation: NONREACTIVE

## 2023-06-16 LAB — HEPATITIS C ANTIBODY: Hepatitis C Ab: NONREACTIVE

## 2023-06-18 DIAGNOSIS — G4733 Obstructive sleep apnea (adult) (pediatric): Secondary | ICD-10-CM | POA: Diagnosis not present

## 2023-06-19 ENCOUNTER — Encounter: Payer: Self-pay | Admitting: *Deleted

## 2023-06-30 DIAGNOSIS — G4733 Obstructive sleep apnea (adult) (pediatric): Secondary | ICD-10-CM | POA: Diagnosis not present

## 2023-07-05 DIAGNOSIS — E119 Type 2 diabetes mellitus without complications: Secondary | ICD-10-CM | POA: Diagnosis not present

## 2023-07-05 LAB — HM DIABETES EYE EXAM

## 2023-07-06 ENCOUNTER — Encounter: Payer: Medicare HMO | Admitting: Family Medicine

## 2023-07-12 ENCOUNTER — Encounter: Payer: Self-pay | Admitting: Family Medicine

## 2023-07-12 ENCOUNTER — Ambulatory Visit (INDEPENDENT_AMBULATORY_CARE_PROVIDER_SITE_OTHER): Payer: Medicare HMO | Admitting: Family Medicine

## 2023-07-12 VITALS — BP 128/80 | HR 90 | Temp 98.1°F | Ht 63.5 in | Wt 177.2 lb

## 2023-07-12 DIAGNOSIS — E559 Vitamin D deficiency, unspecified: Secondary | ICD-10-CM

## 2023-07-12 DIAGNOSIS — E119 Type 2 diabetes mellitus without complications: Secondary | ICD-10-CM

## 2023-07-12 DIAGNOSIS — Z0000 Encounter for general adult medical examination without abnormal findings: Secondary | ICD-10-CM

## 2023-07-12 DIAGNOSIS — F32A Depression, unspecified: Secondary | ICD-10-CM

## 2023-07-12 DIAGNOSIS — E785 Hyperlipidemia, unspecified: Secondary | ICD-10-CM | POA: Diagnosis not present

## 2023-07-12 DIAGNOSIS — F419 Anxiety disorder, unspecified: Secondary | ICD-10-CM

## 2023-07-12 DIAGNOSIS — Z1159 Encounter for screening for other viral diseases: Secondary | ICD-10-CM

## 2023-07-12 DIAGNOSIS — Z114 Encounter for screening for human immunodeficiency virus [HIV]: Secondary | ICD-10-CM

## 2023-07-12 DIAGNOSIS — I10 Essential (primary) hypertension: Secondary | ICD-10-CM

## 2023-07-12 DIAGNOSIS — Z85820 Personal history of malignant melanoma of skin: Secondary | ICD-10-CM

## 2023-07-12 DIAGNOSIS — E1169 Type 2 diabetes mellitus with other specified complication: Secondary | ICD-10-CM | POA: Diagnosis not present

## 2023-07-12 DIAGNOSIS — Z79899 Other long term (current) drug therapy: Secondary | ICD-10-CM

## 2023-07-12 DIAGNOSIS — Z1211 Encounter for screening for malignant neoplasm of colon: Secondary | ICD-10-CM

## 2023-07-12 DIAGNOSIS — K219 Gastro-esophageal reflux disease without esophagitis: Secondary | ICD-10-CM

## 2023-07-12 DIAGNOSIS — R7401 Elevation of levels of liver transaminase levels: Secondary | ICD-10-CM

## 2023-07-12 DIAGNOSIS — Z1231 Encounter for screening mammogram for malignant neoplasm of breast: Secondary | ICD-10-CM | POA: Insufficient documentation

## 2023-07-12 NOTE — Assessment & Plan Note (Signed)
 Neg  HIV screen

## 2023-07-12 NOTE — Assessment & Plan Note (Signed)
 bp in fair control at this time  BP Readings from Last 1 Encounters:  07/12/23 128/80   No changes needed Most recent labs reviewed  Disc lifstyle change with low sodium diet and exercise  Continue losartan 50 mg daily

## 2023-07-12 NOTE — Progress Notes (Signed)
 Subjective:    Patient ID: Joan Ochoa, female    DOB: [birth year], 53 y.o.   MRN: [id number]  HPI  Here for health maintenance exam and to review chronic medical problems   Wt Readings from Last 3 Encounters:  07/12/23 177 lb 4 oz (80.4 kg)  06/15/23 179 lb 4 oz (81.3 kg)  06/14/21 172 lb (78 kg)   30.91 kg/m  Vitals:   07/12/23 0956 07/12/23 1034  BP: (!) 140/86 128/80  Pulse: 90   Temp: 98.1 F (36.7 C)   SpO2: 96%     Immunization History  Administered Date(s) Administered   Influenza, Seasonal, Injecte, Preservative Fre 06/15/2023   PFIZER Comirnaty(Gray Top)Covid-19 Tri-Sucrose Vaccine 10/07/2019, 10/30/2019   Tdap 12/16/2019    Health Maintenance Due  Topic Date Due   Pneumococcal Vaccine 26-48 Years old (1 of 2 - PCV) Never done   FOOT EXAM  Never done   OPHTHALMOLOGY EXAM  Never done   Diabetic kidney evaluation - Urine ACR  Never done   Colonoscopy  Never done   Medicare Annual Wellness (AWV)  11/06/2020   Thinking about shingrix  Had shingles before   Mammogram- was in 2023  Self breast exam  Gyn health Hysterectomy in 08    Colon cancer screening  Colonoscopy with [facility] - 02/2022   Bone health   Falls-none  Cindee Lame  Supplements  Last vitamin D Lab Results  Component Value Date   VD25OH 63.85 06/15/2023    Exercise  Set up to start - just bought treadmill  Has weights Ab machine    Dermatology care at [place] [facility] History of melanoma in past  Goes regularly  Wears sun protection     Mood    07/12/2023   10:09 AM 06/15/2023   11:06 AM  Depression screen PHQ 2/9  Decreased Interest 2 1  Down, Depressed, Hopeless 1 1  PHQ - 2 Score 3 2  Altered sleeping 2 1  Tired, decreased energy 1 1  Change in appetite 0 1  Feeling bad or failure about yourself  1 1  Trouble concentrating 2 1  Moving slowly or fidgety/restless 0 0  Suicidal thoughts 0 0  PHQ-9 Score 9 7  Difficult doing work/chores  Somewhat difficult    Anx/dep May have GAD and PTSD Sees psychiatry remotely as well as counselor  Continues prestiq and trazodone and klonopin     HTN bp is stable today  No cp or palpitations or headaches or edema  No side effects to medicines  BP Readings from Last 3 Encounters:  07/12/23 128/80  06/15/23 128/72  06/14/21 (!) 142/80    Losartan 50 mg daily   Lab Results  Component Value Date   NA 139 06/15/2023   K 4.6 06/15/2023   CO2 27 06/15/2023   GLUCOSE 141 (H) 06/15/2023   BUN 11 06/15/2023   CREATININE 0.83 06/15/2023   CALCIUM 9.4 06/15/2023   GFR 80.33 06/15/2023   GFRNONAA >60 06/27/2015    GERD Omeprazole 40 mg prn  Avg every other day   Lab Results  Component Value Date   VITAMINB12 838 06/15/2023    Hep C screen and HIV screen negative   Lab Results  Component Value Date   ALT 65 (H) 06/15/2023   AST 31 06/15/2023   ALKPHOS 85 06/15/2023   BILITOT 0.4 06/15/2023   Elevated ALT Per pt -elevated before  Etoh is very rare  Tylenol occational-last was few month ago  Hyperlipidemia Lab Results  Component Value Date   CHOL 220 (H) 06/15/2023   HDL 54.30 06/15/2023   LDLCALC 120 (H) 06/15/2023   TRIG 226.0 (H) 06/15/2023   CHOLHDL 4 06/15/2023   Diet controlled  Improved diet since getting labs back    Prediabetes Lab Results  Component Value Date   HGBA1C 7.4 (H) 06/15/2023  Up from 6.5 at [facility] last spring  Really working on this in diet now   Cut back on processed frozen meals  Pasta White potato  Produce instead of sweets  Veggies instead of chips   Has lost 2-3 lb  Working on water intake  No sugar drinks   DM2 in mother and sister   Patient Active Problem List   Diagnosis Date Noted   Elevated ALT measurement 07/12/2023   Encounter for screening mammogram for breast cancer 07/12/2023   Routine general medical examination at a health care facility 07/12/2023   Current use of proton pump inhibitor 06/15/2023   H/O malignant  melanoma of back 06/15/2023   Encounter for screening for HIV 06/15/2023   Encounter for hepatitis C screening test for low risk patient 06/15/2023   Vitamin D deficiency 06/15/2023   History of hypokalemia 06/15/2023   Colon cancer screening 06/15/2023   Anxiety and depression 08/22/2017   Depression 08/22/2017   Hyperlipidemia associated with type 2 diabetes mellitus (HCC) 08/22/2017   Migraine without aura and without status migrainosus, not intractable 08/22/2017   New onset type 2 diabetes mellitus (HCC) 08/22/2017   PTSD (post-traumatic stress disorder) 08/22/2017   Essential hypertension 08/18/2017   GERD without esophagitis 08/18/2017   Past Medical History:  Diagnosis Date   Allergy    Anxiety    Most of my life as adult   Depression 05/13/2014   GERD (gastroesophageal reflux disease)    History of dysplastic nevus 06/02/2020   MODERATE TO SEVERELY ATYPICAL MOLE at left upper back medial/excision    Hypertension 2009   Sleep apnea 2024   Have cpap   Past Surgical History:  Procedure Laterality Date   ABDOMINAL HYSTERECTOMY  2008   APPENDECTOMY  1990   CHOLECYSTECTOMY  2013   NASAL SINUS SURGERY     Social History   Tobacco Use   Smoking status: Never   Smokeless tobacco: Never  Substance Use Topics   Alcohol use: No   Drug use: Never   Family History  Problem Relation Age of Onset   Arthritis Mother    Depression Mother    Diabetes Mother    Hearing loss Mother    Hypertension Mother    Varicose Veins Mother    Cancer Father    COPD Father    Stroke Father    Early death Maternal Grandmother    ADD / ADHD Daughter    Depression Sister    Diabetes Sister    Hypertension Sister    ADD / ADHD Daughter    Depression Sister    Diabetes Sister    Hypertension Sister    Allergies  Allergen Reactions   Codeine Nausea And Vomiting   Prednisone Other (See Comments)    Hallucinations , body turns real red ,hot and shortness of breath    Current  Outpatient Medications on File Prior to Visit  Medication Sig Dispense Refill   Biotin 5000 MCG SUBL Place under the tongue.     Cholecalciferol (VITAMIN D-1000 MAX ST) 25 MCG (1000 UT) tablet Take by mouth.     clonazePAM (  KLONOPIN) 0.25 MG disintegrating tablet Take 0.25 mg by mouth daily as needed.     desvenlafaxine (PRISTIQ) 50 MG 24 hr tablet Take 50 mg by mouth daily.     fluticasone (FLONASE) 50 MCG/ACT nasal spray Place into the nose.     losartan (COZAAR) 50 MG tablet      omeprazole (PRILOSEC) 40 MG capsule Take 1 capsule (40 mg total) by mouth daily as needed. 90 capsule 1   potassium gluconate 595 (99 K) MG TABS tablet Take by mouth.     traZODone (DESYREL) 100 MG tablet Take 100 mg by mouth at bedtime as needed for sleep.     No current facility-administered medications on file prior to visit.    Review of Systems  Constitutional:  Positive for fatigue. Negative for activity change, appetite change, fever and unexpected weight change.  HENT:  Negative for congestion, ear pain, rhinorrhea, sinus pressure and sore throat.   Eyes:  Negative for pain, redness and visual disturbance.  Respiratory:  Negative for cough, shortness of breath and wheezing.   Cardiovascular:  Negative for chest pain and palpitations.  Gastrointestinal:  Negative for abdominal pain, blood in stool, constipation and diarrhea.  Endocrine: Positive for polyuria. Negative for polydipsia.  Genitourinary:  Negative for dysuria, frequency and urgency.  Musculoskeletal:  Negative for arthralgias, back pain and myalgias.  Skin:  Negative for pallor and rash.  Allergic/Immunologic: Negative for environmental allergies.  Neurological:  Negative for dizziness, syncope and headaches.  Hematological:  Negative for adenopathy. Does not bruise/bleed easily.  Psychiatric/Behavioral:  Positive for dysphoric mood and sleep disturbance. Negative for decreased concentration. The patient is nervous/anxious.         Objective:   Physical Exam Constitutional:      General: She is not in acute distress.    Appearance: Normal appearance. She is well-developed. She is obese. She is not ill-appearing or diaphoretic.  HENT:     Head: Normocephalic and atraumatic.     Right Ear: Tympanic membrane, ear canal and external ear normal.     Left Ear: Tympanic membrane, ear canal and external ear normal.     Nose: Nose normal. No congestion.     Mouth/Throat:     Mouth: Mucous membranes are moist.     Pharynx: Oropharynx is clear. No posterior oropharyngeal erythema.  Eyes:     General: No scleral icterus.    Extraocular Movements: Extraocular movements intact.     Conjunctiva/sclera: Conjunctivae normal.     Pupils: Pupils are equal, round, and reactive to light.  Neck:     Thyroid: No thyromegaly.     Vascular: No carotid bruit or JVD.  Cardiovascular:     Rate and Rhythm: Normal rate and regular rhythm.     Pulses: Normal pulses.     Heart sounds: Normal heart sounds.     No gallop.  Pulmonary:     Effort: Pulmonary effort is normal. No respiratory distress.     Breath sounds: Normal breath sounds. No wheezing.     Comments: Good air exch Chest:     Chest wall: No tenderness.  Abdominal:     General: Bowel sounds are normal. There is no distension or abdominal bruit.     Palpations: Abdomen is soft. There is no mass.     Tenderness: There is no abdominal tenderness.     Hernia: No hernia is present.  Genitourinary:    Comments: Breast exam: No mass, nodules, thickening, tenderness, bulging, retraction, inflamation,  nipple discharge or skin changes noted.  No axillary or clavicular LA.     Musculoskeletal:        General: No tenderness. Normal range of motion.     Cervical back: Normal range of motion and neck supple. No rigidity. No muscular tenderness.     Right lower leg: No edema.     Left lower leg: No edema.     Comments: No kyphosis   Lymphadenopathy:     Cervical: No cervical  adenopathy.  Skin:    General: Skin is warm and dry.     Coloration: Skin is not pale.     Findings: No erythema or rash.     Comments: Solar lentigines diffusely   Neurological:     Mental Status: She is alert. Mental status is at baseline.     Cranial Nerves: No cranial nerve deficit.     Motor: No abnormal muscle tone.     Coordination: Coordination normal.     Gait: Gait normal.     Deep Tendon Reflexes: Reflexes are normal and symmetric. Reflexes normal.  Psychiatric:        Mood and Affect: Mood normal.        Cognition and Memory: Cognition and memory normal.     Comments: Candidly discusses symptoms and stressors             Assessment & Plan:   Problem List Items Addressed This Visit       Cardiovascular and Mediastinum   Essential hypertension   bp in fair control at this time  BP Readings from Last 1 Encounters:  07/12/23 128/80   No changes needed Most recent labs reviewed  Disc lifstyle change with low sodium diet and exercise  Continue losartan 50 mg daily        Digestive   GERD without esophagitis   Controled with omeprazole every other day Encouraged to avoid triggers         Endocrine   New onset type 2 diabetes mellitus (HCC)   Lab Results  Component Value Date   HGBA1C 7.4 (H) 06/15/2023   Discussed low glycemic diet and exercise  Pt declines metformin for now-will work on lifestyle and weight loss first  On arb  Holding off on statin due to elevated liver test  Normal foot exam Will discuss eye care at follow up as well as microalb      Hyperlipidemia associated with type 2 diabetes mellitus (HCC)   Disc goals for lipids and reasons to control them Rev last labs with pt Rev low sat fat diet in detail Goal LDL of 70 or below  Current 120  Wants to work on diet   Holding off statin due to liver enzyme elevation         Other   Vitamin D deficiency   Last vitamin D Lab Results  Component Value Date   VD25OH 63.85  06/15/2023   Vitamin D level is therapeutic with current supplementation Disc importance of this to bone and overall health       Routine general medical examination at a health care facility - Primary   Reviewed health habits including diet and exercise and skin cancer prevention Reviewed appropriate screening tests for age  Also reviewed health mt list, fam hx and immunization status , as well as social and family history   See HPI Labs reviewed and ordered Health Maintenance  Topic Date Due   Pneumococcal Vaccination (1 of 2 - PCV)  Never done   Complete foot exam   Never done   Eye exam for diabetics  Never done   Yearly kidney health urinalysis for diabetes  Never done   Colon Cancer Screening  Never done   Medicare Annual Wellness Visit  11/06/2020   COVID-19 Vaccine (3 - Pfizer risk series) 06/30/2024*   Zoster (Shingles) Vaccine (1 of 2) 09/12/2024*   Mammogram  10/01/2023   Hemoglobin A1C  12/13/2023   Yearly kidney function blood test for diabetes  06/14/2024   DTaP/Tdap/Td vaccine (2 - Td or Tdap) 12/15/2029   Flu Shot  Completed   Hepatitis C Screening  Completed   HIV Screening  Completed   HPV Vaccine  Aged Out  *Topic was postponed. The date shown is not the original due date.   May consider shingrix in future  Mammo ordered Sent for utd colonoscopy report Discussed fall prevention, supplements and exercise for bone density  Utd derm care  PHQ 9   stable/baseline with psych care  May consider shingrix in future       H/O malignant melanoma of back   Continues regular derm visits       Encounter for screening mammogram for breast cancer   Mammogram ordered Pt will call to schedule      Relevant Orders   MM 3D SCREENING MAMMOGRAM BILATERAL BREAST   Encounter for screening for HIV   Neg HIV screen      Encounter for hepatitis C screening test for low risk patient   Neg hep C screen      Elevated ALT measurement   Lab Results  Component Value  Date   ALT 65 (H) 06/15/2023   AST 31 06/15/2023   ALKPHOS 85 06/15/2023   BILITOT 0.4 06/15/2023   No etoh or tylenol Will order [place]  Possible fatty liver  Will work on weight loss       Relevant Orders   US Abdomen Complete   Depression   Continues psychiatric care  Pristiq, trazodone and prn klonopin PHQ 9       Current use of proton pump inhibitor   Lab Results  Component Value Date   VITAMINB12 838 06/15/2023   Watching this and D      Colon cancer screening   Colonoscopy utd at [facility] for report 2022 or 2023       Anxiety and depression   Pt has what sounds like GAD and PTSD Sees psychiatry (virtual/ongoing) in [place] and overall stable  Taking pristiq 50 mg daily and clonazepam prn 0.25 mg  Trazodone 100 mg prn sleep  Has counselor  Good insight  Thinks this combo is working fairly well for her

## 2023-07-12 NOTE — Assessment & Plan Note (Signed)
 Reviewed health habits including diet and exercise and skin cancer prevention Reviewed appropriate screening tests for age  Also reviewed health mt list, fam hx and immunization status , as well as social and family history   See HPI Labs reviewed and ordered Health Maintenance  Topic Date Due   Pneumococcal Vaccination (1 of 2 - PCV) Never done   Complete foot exam   Never done   Eye exam for diabetics  Never done   Yearly kidney health urinalysis for diabetes  Never done   Colon Cancer Screening  Never done   Medicare Annual Wellness Visit  11/06/2020   COVID-19 Vaccine (3 - Pfizer risk series) 06/30/2024*   Zoster (Shingles) Vaccine (1 of 2) 09/12/2024*   Mammogram  10/01/2023   Hemoglobin A1C  12/13/2023   Yearly kidney function blood test for diabetes  06/14/2024   DTaP/Tdap/Td vaccine (2 - Td or Tdap) 12/15/2029   Flu Shot  Completed   Hepatitis C Screening  Completed   HIV Screening  Completed   HPV Vaccine  Aged Out  *Topic was postponed. The date shown is not the original due date.   May consider shingrix in future  Mammo ordered Sent for utd colonoscopy report Discussed fall prevention, supplements and exercise for bone density  Utd derm care  PHQ 9   stable/baseline with psych care  May consider shingrix in future

## 2023-07-12 NOTE — Assessment & Plan Note (Signed)
 Colonoscopy utd at [facility] for report 2022 or 2023

## 2023-07-12 NOTE — Assessment & Plan Note (Signed)
 Neg hep C screen

## 2023-07-12 NOTE — Assessment & Plan Note (Signed)
 Lab Results  Component Value Date   VITAMINB12 838 06/15/2023   Watching this and D

## 2023-07-12 NOTE — Assessment & Plan Note (Signed)
 Controled with omeprazole every other day Encouraged to avoid triggers

## 2023-07-12 NOTE — Patient Instructions (Addendum)
 If you are interested in the new shingles vaccine (Shingrix) - call your local pharmacy to check on coverage and availability   Start your exercise program  Add some strength training to your routine, this is important for bone and brain health and can reduce your risk of falls and help your body use insulin properly and regulate weight  Light weights, exercise bands , and internet videos are a good way to start  Yoga (chair or regular), machines , floor exercises or a gym with machines are also good options    For diabetes Try to get most of your carbohydrates from produce (with the exception of white potatoes) and whole grains Eat less bread/pasta/rice/snack foods/cereals/sweets and other items from the middle of the grocery store (processed carbs)  For cholesterol Avoid red meat/ fried foods/ egg yolks/ fatty breakfast meats/ butter, cheese and high fat dairy/ and shellfish    I put the referral in for liver ultrasound  Please let us know if you don't hear in 1-2 weeks   Follow up here in 3 months     You have an order for:  []   2D Mammogram  [x]   3D Mammogram  []   Bone Density     Please call for appointment:   [x]   [facility]  [address] [place] [address]  [phone]  []   [facility])   [address]  [place], [place] [address]  [phone]  []   [facility]       [address] [place], [place]        [phone]         []   [facility]  [address] [place], [place]  [phone]  []  [facility]   [address]   [place], [place] [address]  [phone]  []  [facility]  [address] [place], [place] [address] [phone]    Make sure to wear two piece clothing  No lotions powders or  deodorants the day of the appointment Make sure to bring picture ID and insurance card.  Bring list of medications you are currently taking including any supplements.   Schedule your screening mammogram through MyChart!   Select [facility] imaging sites can now be scheduled through MyChart.  Log into your MyChart account.  Go to 'Visit' (or 'Appointments' if  on mobile App) --> Schedule an  Appointment  Under 'Select a Reason for Visit' choose the Mammogram  Screening option.  Complete the pre-visit questions  and select the time and place that  best fits your schedule

## 2023-07-12 NOTE — Assessment & Plan Note (Signed)
 Continues psychiatric care  Pristiq, trazodone and prn klonopin PHQ 9

## 2023-07-12 NOTE — Assessment & Plan Note (Signed)
 Last vitamin D Lab Results  Component Value Date   VD25OH 63.85 06/15/2023   Vitamin D level is therapeutic with current supplementation Disc importance of this to bone and overall health

## 2023-07-12 NOTE — Assessment & Plan Note (Signed)
 Lab Results  Component Value Date   ALT 65 (H) 06/15/2023   AST 31 06/15/2023   ALKPHOS 85 06/15/2023   BILITOT 0.4 06/15/2023   No etoh or tylenol Will order [place]  Possible fatty liver  Will work on weight loss

## 2023-07-12 NOTE — Assessment & Plan Note (Signed)
 Continues regular derm visits

## 2023-07-12 NOTE — Assessment & Plan Note (Signed)
 Disc goals for lipids and reasons to control them Rev last labs with pt Rev low sat fat diet in detail Goal LDL of 70 or below  Current 120  Wants to work on diet   Holding off statin due to liver enzyme elevation

## 2023-07-12 NOTE — Assessment & Plan Note (Signed)
 Pt has what sounds like GAD and PTSD Sees psychiatry (virtual/ongoing) in [place] and overall stable  Taking pristiq 50 mg daily and clonazepam prn 0.25 mg  Trazodone 100 mg prn sleep  Has counselor  Good insight  Thinks this combo is working fairly well for her

## 2023-07-12 NOTE — Assessment & Plan Note (Addendum)
 Lab Results  Component Value Date   HGBA1C 7.4 (H) 06/15/2023   Discussed low glycemic diet and exercise  Pt declines metformin for now-will work on lifestyle and weight loss first  On arb  Holding off on statin due to elevated liver test  Normal foot exam Will discuss eye care at follow up as well as microalb

## 2023-07-12 NOTE — Assessment & Plan Note (Signed)
Mammogram ordered °Pt will call to schedule  °

## 2023-07-13 ENCOUNTER — Encounter: Payer: Self-pay | Admitting: Family Medicine

## 2023-07-13 ENCOUNTER — Ambulatory Visit
Admission: RE | Admit: 2023-07-13 | Discharge: 2023-07-13 | Disposition: A | Discharge status: Home / Self Care | Payer: Medicare HMO | Source: Ambulatory Visit | Attending: Family Medicine | Admitting: Family Medicine

## 2023-07-13 DIAGNOSIS — K760 Fatty (change of) liver, not elsewhere classified: Secondary | ICD-10-CM | POA: Diagnosis not present

## 2023-07-13 DIAGNOSIS — R7401 Elevation of levels of liver transaminase levels: Secondary | ICD-10-CM

## 2023-07-13 DIAGNOSIS — K8689 Other specified diseases of pancreas: Secondary | ICD-10-CM | POA: Diagnosis not present

## 2023-07-13 DIAGNOSIS — D7389 Other diseases of spleen: Secondary | ICD-10-CM | POA: Insufficient documentation

## 2023-07-14 DIAGNOSIS — F4312 Post-traumatic stress disorder, chronic: Secondary | ICD-10-CM | POA: Diagnosis not present

## 2023-07-14 DIAGNOSIS — F411 Generalized anxiety disorder: Secondary | ICD-10-CM | POA: Diagnosis not present

## 2023-07-14 DIAGNOSIS — F331 Major depressive disorder, recurrent, moderate: Secondary | ICD-10-CM | POA: Diagnosis not present

## 2023-07-16 DIAGNOSIS — G4733 Obstructive sleep apnea (adult) (pediatric): Secondary | ICD-10-CM | POA: Diagnosis not present

## 2023-07-26 DIAGNOSIS — Z0100 Encounter for examination of eyes and vision without abnormal findings: Secondary | ICD-10-CM | POA: Diagnosis not present

## 2023-08-07 ENCOUNTER — Encounter: Payer: Self-pay | Admitting: Family Medicine

## 2023-08-07 ENCOUNTER — Ambulatory Visit (INDEPENDENT_AMBULATORY_CARE_PROVIDER_SITE_OTHER): Payer: Medicare HMO | Admitting: Family Medicine

## 2023-08-07 ENCOUNTER — Encounter: Payer: Self-pay | Admitting: *Deleted

## 2023-08-07 VITALS — BP 121/82 | HR 89 | Temp 98.6°F | Ht 63.5 in | Wt 175.2 lb

## 2023-08-07 DIAGNOSIS — E119 Type 2 diabetes mellitus without complications: Secondary | ICD-10-CM

## 2023-08-07 DIAGNOSIS — R7401 Elevation of levels of liver transaminase levels: Secondary | ICD-10-CM | POA: Diagnosis not present

## 2023-08-07 DIAGNOSIS — D7389 Other diseases of spleen: Secondary | ICD-10-CM

## 2023-08-07 DIAGNOSIS — E785 Hyperlipidemia, unspecified: Secondary | ICD-10-CM

## 2023-08-07 DIAGNOSIS — E1169 Type 2 diabetes mellitus with other specified complication: Secondary | ICD-10-CM | POA: Diagnosis not present

## 2023-08-07 DIAGNOSIS — Z79899 Other long term (current) drug therapy: Secondary | ICD-10-CM

## 2023-08-07 DIAGNOSIS — K760 Fatty (change of) liver, not elsewhere classified: Secondary | ICD-10-CM | POA: Diagnosis not present

## 2023-08-07 LAB — HEPATIC FUNCTION PANEL
ALT: 50 U/L — ABNORMAL HIGH (ref 0–35)
AST: 33 U/L (ref 0–37)
Albumin: 4.7 g/dL (ref 3.5–5.2)
Alkaline Phosphatase: 87 U/L (ref 39–117)
Bilirubin, Direct: 0.1 mg/dL (ref 0.0–0.3)
Total Bilirubin: 0.7 mg/dL (ref 0.2–1.2)
Total Protein: 7.6 g/dL (ref 6.0–8.3)

## 2023-08-07 NOTE — Patient Instructions (Addendum)
 Fit in cardio when you can and stay physically active Add some strength training to your routine, this is important for bone and brain health and can reduce your risk of falls and help your body use insulin properly and regulate weight  Light weights, exercise bands , and internet videos are a good way to start  Yoga (chair or regular), machines , floor exercises or a gym with machines are also good options   Try to get most of your carbohydrates from produce (with the exception of white potatoes) and whole grains Eat less bread/pasta/rice/snack foods/cereals/sweets and other items from the middle of the grocery store (processed carbs)  I want to re check ultrasound for the spleen nodule in 3-6 months   I put the referral in for repeat ultrasound  Please let us know if you don't hear in 1-2 weeks

## 2023-08-07 NOTE — Assessment & Plan Note (Addendum)
 Noted 1 cm diameter max, hypoechoic / see overview above  Incidental finding  No symptoms  Will re image in 3-6 months with [place]  If growth consider other imaging/ specialty care or bx

## 2023-08-07 NOTE — Progress Notes (Signed)
 Subjective:    Patient ID: Joan Ochoa, female    DOB: March 31, 1971, 53 y.o.   MRN: [id number]  HPI  Wt Readings from Last 3 Encounters:  08/07/23 175 lb 4 oz (79.5 kg)  07/12/23 177 lb 4 oz (80.4 kg)  06/15/23 179 lb 4 oz (81.3 kg)   30.56 kg/m  Vitals:   08/07/23 0956 08/07/23 1020  BP: (!) 144/92 121/82  Pulse: 89   Temp: 98.6 F (37 C)   SpO2: 96%     Pt presents for follow up of [place] of abd , DM2, hyperlipidemia and elevated LFTs  Noted  Fatty liver  Also hypoechoic nodule of spleen   Report IMPRESSION: *Diffuse fatty infiltration of the liver. *Hypoechoic nodule in the anterior margin of the mid upper pole of the spleen measures 1.0 x 0.7 x 0.8 cm. Significance unclear. It was not identified on the prior CT. Close follow-up ultrasound recommended.   Has had liver enzyme abn Lab Results  Component Value Date   ALT 65 (H) 06/15/2023   AST 31 06/15/2023   ALKPHOS 85 06/15/2023   BILITOT 0.4 06/15/2023   Weight is down 2 lb today  4 lb from January   No tylenol products as a rule     (once in a blue moon)  Alcohol- none   Ibuprofen - not often either   Takes B complex / B12   Is off omeprazole now  Taking pepcid over the counter prn Watching diet    Lab Results  Component Value Date   CHOL 220 (H) 06/15/2023   HDL 54.30 06/15/2023   LDLCALC 120 (H) 06/15/2023   TRIG 226.0 (H) 06/15/2023   CHOLHDL 4 06/15/2023   Lab Results  Component Value Date   HGBA1C 7.4 (H) 06/15/2023    Is really working on diet  Cut out fatty/greasy foods  Also carbs   More physical work around the house  Has some exercise equip at house to start  Plans to start that tomorrow am    Patient Active Problem List   Diagnosis Date Noted   Nodule of spleen 07/13/2023   Hepatic steatosis 07/13/2023   Elevated ALT measurement 07/12/2023   Encounter for screening mammogram for breast cancer 07/12/2023   Routine general medical examination at a health care  facility 07/12/2023   H/O malignant melanoma of back 06/15/2023   Encounter for screening for HIV 06/15/2023   Encounter for hepatitis C screening test for low risk patient 06/15/2023   Vitamin D deficiency 06/15/2023   History of hypokalemia 06/15/2023   Colon cancer screening 06/15/2023   Anxiety and depression 08/22/2017   Depression 08/22/2017   Hyperlipidemia associated with type 2 diabetes mellitus (HCC) 08/22/2017   Migraine without aura and without status migrainosus, not intractable 08/22/2017   New onset type 2 diabetes mellitus (HCC) 08/22/2017   PTSD (post-traumatic stress disorder) 08/22/2017   Essential hypertension 08/18/2017   GERD without esophagitis 08/18/2017   Past Medical History:  Diagnosis Date   Allergy    Anxiety    Most of my life as adult   Depression 05/13/2014   GERD (gastroesophageal reflux disease)    History of dysplastic nevus 06/02/2020   MODERATE TO SEVERELY ATYPICAL MOLE at left upper back medial/excision    Hypertension 2009   Sleep apnea 2024   Have cpap   Past Surgical History:  Procedure Laterality Date   ABDOMINAL HYSTERECTOMY  2008   APPENDECTOMY  1990   CHOLECYSTECTOMY  2013  NASAL SINUS SURGERY     Social History   Tobacco Use   Smoking status: Never   Smokeless tobacco: Never  Substance Use Topics   Alcohol use: No   Drug use: Never   Family History  Problem Relation Age of Onset   Arthritis Mother    Depression Mother    Diabetes Mother    Hearing loss Mother    Hypertension Mother    Varicose Veins Mother    Cancer Father    COPD Father    Stroke Father    Early death Maternal Grandmother    ADD / ADHD Daughter    Depression Sister    Diabetes Sister    Hypertension Sister    ADD / ADHD Daughter    Depression Sister    Diabetes Sister    Hypertension Sister    Allergies  Allergen Reactions   Codeine Nausea And Vomiting   Prednisone Other (See Comments)    Hallucinations , body turns real red ,hot and  shortness of breath    Current Outpatient Medications on File Prior to Visit  Medication Sig Dispense Refill   Biotin 5000 MCG SUBL Place under the tongue.     Cholecalciferol (VITAMIN D-1000 MAX ST) 25 MCG (1000 UT) tablet Take by mouth.     clonazePAM (KLONOPIN) 0.25 MG disintegrating tablet Take 0.25 mg by mouth daily as needed.     desvenlafaxine (PRISTIQ) 50 MG 24 hr tablet Take 50 mg by mouth daily.     famotidine (PEPCID) 10 MG tablet Take 10 mg by mouth daily as needed for heartburn or indigestion.     fluticasone (FLONASE) 50 MCG/ACT nasal spray Place into the nose.     losartan (COZAAR) 50 MG tablet      potassium gluconate 595 (99 K) MG TABS tablet Take by mouth.     traZODone (DESYREL) 100 MG tablet Take 100 mg by mouth at bedtime as needed for sleep.     No current facility-administered medications on file prior to visit.    Review of Systems  Constitutional:  Negative for activity change, appetite change, fatigue, fever and unexpected weight change.  HENT:  Negative for congestion, ear pain, rhinorrhea, sinus pressure and sore throat.   Eyes:  Negative for pain, redness and visual disturbance.  Respiratory:  Negative for cough, shortness of breath and wheezing.   Cardiovascular:  Negative for chest pain and palpitations.  Gastrointestinal:  Negative for abdominal pain, blood in stool, constipation and diarrhea.  Endocrine: Negative for polydipsia and polyuria.  Genitourinary:  Negative for dysuria, frequency and urgency.  Musculoskeletal:  Negative for arthralgias, back pain and myalgias.  Skin:  Negative for pallor and rash.  Allergic/Immunologic: Negative for environmental allergies.  Neurological:  Negative for dizziness, syncope and headaches.  Hematological:  Negative for adenopathy. Does not bruise/bleed easily.  Psychiatric/Behavioral:  Negative for decreased concentration and dysphoric mood. The patient is not nervous/anxious.        Objective:   Physical  Exam Constitutional:      General: She is not in acute distress.    Appearance: Normal appearance. She is well-developed. She is obese. She is not ill-appearing or diaphoretic.  HENT:     Head: Normocephalic and atraumatic.  Eyes:     Conjunctiva/sclera: Conjunctivae normal.     Pupils: Pupils are equal, round, and reactive to light.  Neck:     Thyroid: No thyromegaly.     Vascular: No carotid bruit or JVD.  Cardiovascular:  Rate and Rhythm: Normal rate and regular rhythm.     Heart sounds: Normal heart sounds.     No gallop.  Pulmonary:     Effort: Pulmonary effort is normal. No respiratory distress.     Breath sounds: Normal breath sounds. No wheezing or rales.  Abdominal:     General: Abdomen is protuberant. There is no distension or abdominal bruit.     Palpations: Abdomen is soft. There is no shifting dullness, hepatomegaly, splenomegaly or pulsatile mass.     Tenderness: There is no abdominal tenderness.  Musculoskeletal:     Cervical back: Normal range of motion and neck supple.     Right lower leg: No edema.     Left lower leg: No edema.  Lymphadenopathy:     Cervical: No cervical adenopathy.  Skin:    General: Skin is warm and dry.     Coloration: Skin is not pale.     Findings: No rash.  Neurological:     Mental Status: She is alert.     Coordination: Coordination normal.     Deep Tendon Reflexes: Reflexes are normal and symmetric. Reflexes normal.  Psychiatric:        Mood and Affect: Mood normal.           Assessment & Plan:   Problem List Items Addressed This Visit       Digestive   Hepatic steatosis - Primary   With mildly elevated ALT Pt has been working on diet/exercise and 4-5 lb off since then  Reviewed [place] in detail  No etoh  Very rare acetaminophen or ibuprofen  Reassuring exam   Re check lab today  If LFTs increase would consider GI referral   Holding off starting a statin currently       Relevant Orders   Hepatic function  panel     Endocrine   New onset type 2 diabetes mellitus (HCC)   Lab Results  Component Value Date   HGBA1C 7.4 (H) 06/15/2023   Working on diet/exercise Declines glycemic med yet  Down 4-5 lb  Follow up planned for june      Hyperlipidemia associated with type 2 diabetes mellitus (HCC)   Disc goals for lipids and reasons to control them Rev last labs with pt Rev low sat fat diet in detail Lab Results  Component Value Date   LDLCALC 120 (H) 06/15/2023    Is diabetic Working on [employer] off on statin due to elevated ALT currently Does have fatty liver         Other   Nodule of spleen   Noted 1 cm diameter max, hypoechoic / see overview above  Incidental finding  No symptoms  Will re image in 3-6 months with [place]  If growth consider other imaging/ specialty care or bx       Relevant Orders   US Abdomen Complete   Elevated ALT measurement   See a/p for hepatic steatosis       Relevant Orders   Hepatic function panel   RESOLVED: Current use of proton pump inhibitor   Pt was able to stop ppi Now on H2 blocker and watching diet

## 2023-08-07 NOTE — Assessment & Plan Note (Signed)
 Lab Results  Component Value Date   HGBA1C 7.4 (H) 06/15/2023   Working on diet/exercise Declines glycemic med yet  Down 4-5 lb  Follow up planned for june

## 2023-08-07 NOTE — Assessment & Plan Note (Signed)
 See a/p for hepatic steatosis

## 2023-08-07 NOTE — Assessment & Plan Note (Signed)
 Disc goals for lipids and reasons to control them Rev last labs with pt Rev low sat fat diet in detail Lab Results  Component Value Date   LDLCALC 120 (H) 06/15/2023    Is diabetic Working on [employer] off on statin due to elevated ALT currently Does have fatty liver

## 2023-08-07 NOTE — Assessment & Plan Note (Signed)
 Pt was able to stop ppi Now on H2 blocker and watching diet

## 2023-08-07 NOTE — Assessment & Plan Note (Signed)
 With mildly elevated ALT Pt has been working on diet/exercise and 4-5 lb off since then  Reviewed [place] in detail  No etoh  Very rare acetaminophen or ibuprofen  Reassuring exam   Re check lab today  If LFTs increase would consider GI referral   Holding off starting a statin currently

## 2023-08-10 ENCOUNTER — Ambulatory Visit
Admission: RE | Admit: 2023-08-10 | Discharge: 2023-08-10 | Disposition: A | Discharge status: Home / Self Care | Source: Ambulatory Visit | Attending: Family Medicine | Admitting: Family Medicine

## 2023-08-10 DIAGNOSIS — Z1231 Encounter for screening mammogram for malignant neoplasm of breast: Secondary | ICD-10-CM | POA: Diagnosis not present

## 2023-08-13 ENCOUNTER — Encounter: Payer: Self-pay | Admitting: Family Medicine

## 2023-08-16 DIAGNOSIS — G4733 Obstructive sleep apnea (adult) (pediatric): Secondary | ICD-10-CM | POA: Diagnosis not present

## 2023-08-17 DIAGNOSIS — D225 Melanocytic nevi of trunk: Secondary | ICD-10-CM | POA: Diagnosis not present

## 2023-08-17 DIAGNOSIS — D2262 Melanocytic nevi of left upper limb, including shoulder: Secondary | ICD-10-CM | POA: Diagnosis not present

## 2023-08-17 DIAGNOSIS — L821 Other seborrheic keratosis: Secondary | ICD-10-CM | POA: Diagnosis not present

## 2023-08-17 DIAGNOSIS — D2261 Melanocytic nevi of right upper limb, including shoulder: Secondary | ICD-10-CM | POA: Diagnosis not present

## 2023-08-17 DIAGNOSIS — L538 Other specified erythematous conditions: Secondary | ICD-10-CM | POA: Diagnosis not present

## 2023-08-17 DIAGNOSIS — D2271 Melanocytic nevi of right lower limb, including hip: Secondary | ICD-10-CM | POA: Diagnosis not present

## 2023-08-17 DIAGNOSIS — D2272 Melanocytic nevi of left lower limb, including hip: Secondary | ICD-10-CM | POA: Diagnosis not present

## 2023-08-17 DIAGNOSIS — D485 Neoplasm of uncertain behavior of skin: Secondary | ICD-10-CM | POA: Diagnosis not present

## 2023-08-17 DIAGNOSIS — L820 Inflamed seborrheic keratosis: Secondary | ICD-10-CM | POA: Diagnosis not present

## 2023-09-15 DIAGNOSIS — G4733 Obstructive sleep apnea (adult) (pediatric): Secondary | ICD-10-CM | POA: Diagnosis not present

## 2023-09-22 ENCOUNTER — Encounter (HOSPITAL_COMMUNITY): Payer: Self-pay

## 2023-09-25 ENCOUNTER — Other Ambulatory Visit: Payer: Self-pay | Admitting: Family Medicine

## 2023-09-25 NOTE — Telephone Encounter (Signed)
 PCP hasn't filled med before but on med list it says 50 mg historical entry. Rx refill request came from pt request   F/u scheduled on 10/16/23

## 2023-09-25 NOTE — Telephone Encounter (Signed)
 Addressed through refill request from pharmacy

## 2023-09-25 NOTE — Telephone Encounter (Signed)
 Copied from CRM [id number]. Topic: Clinical - Medication Refill >> Sep 25, 2023  2:44 PM Joan Ochoa wrote: Medication: losartan (COZAAR) 50 MG tablet [  Has the patient contacted their pharmacy? Yes (Agent: If no, request that the patient contact the pharmacy for the refill. If patient does not wish to contact the pharmacy document the reason why and proceed with request.) (Agent: If yes, when and what did the pharmacy advise?)  This is the patient's preferred pharmacy:  [facility] - [place], [place] - [address] [place] [address] Phone: [phone] Fax: [phone]  Is this the correct pharmacy for this prescription? Yes If no, delete pharmacy and type the correct one.   Has the prescription been filled recently? Yes  Is the patient out of the medication? Yes  Has the patient been seen for an appointment in the last year OR does the patient have an upcoming appointment? Yes  Can we respond through MyChart? Yes  Agent: Please be advised that Rx refills may take up to 3 business days. We ask that you follow-up with your pharmacy.

## 2023-09-25 NOTE — Telephone Encounter (Signed)
 This is for 100 mg  Her med list and what I noted at last visit is that she takes 50 mg daily   What is she supposed to be on ?  What is she taking?

## 2023-09-25 NOTE — Telephone Encounter (Signed)
 Last Fill: Unk, Historical Provider  Last OV: 08/07/23 Next OV: 10/16/23  Routing to provider for review/authorization.

## 2023-09-26 NOTE — Telephone Encounter (Signed)
 Pt is taking 100mg  daily, she said when she filled out paperwork she thought it was 50 mg but when she got home and looked at bottle she realized it is 100mg , pt ask for 90 day Rx be sent in

## 2023-09-28 DIAGNOSIS — F4312 Post-traumatic stress disorder, chronic: Secondary | ICD-10-CM | POA: Diagnosis not present

## 2023-09-28 DIAGNOSIS — F411 Generalized anxiety disorder: Secondary | ICD-10-CM | POA: Diagnosis not present

## 2023-09-28 DIAGNOSIS — F331 Major depressive disorder, recurrent, moderate: Secondary | ICD-10-CM | POA: Diagnosis not present

## 2023-09-29 ENCOUNTER — Ambulatory Visit

## 2023-09-29 VITALS — Ht 63.5 in | Wt 167.0 lb

## 2023-09-29 DIAGNOSIS — Z0000 Encounter for general adult medical examination without abnormal findings: Secondary | ICD-10-CM

## 2023-09-29 NOTE — Progress Notes (Signed)
 Subjective:   Joan Ochoa is a 53 y.o. who presents for a Medicare Wellness preventive visit.  As a reminder, Annual Wellness Visits don't include a physical exam, and some assessments may be limited, especially if this visit is performed virtually. We may recommend an in-person follow-up visit with your provider if needed.  Visit Complete: Virtual I connected with  Joan Ochoa on 09/29/23 by a video and audio enabled telemedicine application and verified that I am speaking with the correct person using two identifiers.  Patient Location: Home  Provider Location: Office/Clinic  I discussed the limitations of evaluation and management by telemedicine. The patient expressed understanding and agreed to proceed.  Vital Signs: Because this visit was a virtual/telehealth visit, some criteria may be missing or patient reported. Any vitals not documented were not able to be obtained and vitals that have been documented are patient reported.  Persons Participating in Visit: Patient.  AWV Questionnaire: No: Patient Medicare AWV questionnaire was not completed prior to this visit.  Cardiac Risk Factors include: advanced age (>40men, >36 women);dyslipidemia;diabetes mellitus;hypertension     Objective:     Today's Vitals   09/29/23 1014  Weight: 167 lb (75.8 kg)  Height: 5' 3.5" (1.613 m)   Body mass index is 29.12 kg/m.     09/29/2023   10:26 AM 06/14/2021    3:52 PM 06/27/2015   12:30 PM  Advanced Directives  Does Patient Have a Medical Advance Directive? No No No  Would patient like information on creating a medical advance directive?  No - Patient declined Yes - Educational materials given    Current Medications (verified) Outpatient Encounter Medications as of 09/29/2023  Medication Sig   Biotin 5000 MCG SUBL Place under the tongue.   Cholecalciferol (VITAMIN D -1000 MAX ST) 25 MCG (1000 UT) tablet Take by mouth.   clonazePAM (KLONOPIN) 0.25 MG disintegrating tablet  Take 0.25 mg by mouth daily as needed.   desvenlafaxine (PRISTIQ) 50 MG 24 hr tablet Take 50 mg by mouth daily.   famotidine (PEPCID) 10 MG tablet Take 10 mg by mouth daily as needed for heartburn or indigestion.   fluticasone (FLONASE) 50 MCG/ACT nasal spray Place into the nose.   losartan (COZAAR) 100 MG tablet Take 1 tablet (100 mg total) by mouth daily.   potassium gluconate 595 (99 K) MG TABS tablet Take by mouth.   traZODone (DESYREL) 100 MG tablet Take 100 mg by mouth at bedtime as needed for sleep.   No facility-administered encounter medications on file as of 09/29/2023.    Allergies (verified) Codeine and Prednisone   History: Past Medical History:  Diagnosis Date   Allergy    Anxiety    Most of my life as adult   Depression 05/13/2014   Diabetes mellitus without complication (HCC)    GERD (gastroesophageal reflux disease)    History of dysplastic nevus 06/02/2020   MODERATE TO SEVERELY ATYPICAL MOLE at left upper back medial/excision    Hypertension 2009   Sleep apnea 2024   Have cpap   Past Surgical History:  Procedure Laterality Date   ABDOMINAL HYSTERECTOMY  2008   APPENDECTOMY  1990   CHOLECYSTECTOMY  2013   NASAL SINUS SURGERY     Family History  Problem Relation Age of Onset   Arthritis Mother    Depression Mother    Diabetes Mother    Hearing loss Mother    Hypertension Mother    Varicose Veins Mother    Cancer Father  COPD Father    Stroke Father    Depression Sister    Diabetes Sister    Hypertension Sister    Depression Sister    Diabetes Sister    Hypertension Sister    ADD / ADHD Daughter    ADD / ADHD Daughter    Breast cancer Paternal Aunt    Early death Maternal Grandmother    Social History   Socioeconomic History   Marital status: Married    Spouse name: Not on file   Number of children: Not on file   Years of education: Not on file   Highest education level: GED or equivalent  Occupational History   Not on file  Tobacco  Use   Smoking status: Never   Smokeless tobacco: Never  Substance and Sexual Activity   Alcohol use: No   Drug use: Never   Sexual activity: Yes    Birth control/protection: None    Comment: Hysterectomy  Other Topics Concern   Not on file  Social History Narrative   Not on file   Social Drivers of Health   Financial Resource Strain: Low Risk  (09/29/2023)   Overall Financial Resource Strain (CARDIA)    Difficulty of Paying Living Expenses: Not hard at all  Food Insecurity: No Food Insecurity (09/29/2023)   Hunger Vital Sign    Worried About Running Out of Food in the Last Year: Never true    Ran Out of Food in the Last Year: Never true  Transportation Needs: No Transportation Needs (09/29/2023)   PRAPARE - Administrator, Civil Service (Medical): No    Lack of Transportation (Non-Medical): No  Physical Activity: Sufficiently Active (09/29/2023)   Exercise Vital Sign    Days of Exercise per Week: 4 days    Minutes of Exercise per Session: 60 min  Stress: No Stress Concern Present (09/29/2023)   [employer] of Occupational Health - Occupational Stress Questionnaire    Feeling of Stress : Only a little  Social Connections: Moderately Isolated (09/29/2023)   Social Connection and Isolation Panel [NHANES]    Frequency of Communication with Friends and Family: More than three times a week    Frequency of Social Gatherings with Friends and Family: Once a week    Attends Religious Services: Never    Database administrator or Organizations: No    Attends Engineer, structural: Never    Marital Status: Married    Tobacco Counseling Counseling given: Not Answered    Clinical Intake:  Pre-visit preparation completed: Yes  Pain : No/denies pain     BMI - recorded: 29.12 Nutritional Status: BMI 25 -29 Overweight Nutritional Risks: None Diabetes: Yes CBG done?: No Did pt. bring in CBG monitor from home?: No  Lab Results  Component Value Date    HGBA1C 7.4 (H) 06/15/2023     How often do you need to have someone help you when you read instructions, pamphlets, or other written materials from your doctor or pharmacy?: 1 - Never  Interpreter Needed?: No  Comments: lives with husband Information entered by :: B.Celestial Barnfield,LPN   Activities of Daily Living     09/29/2023   10:27 AM  In your present state of health, do you have any difficulty performing the following activities:  Hearing? 0  Vision? 0  Difficulty concentrating or making decisions? 0  Walking or climbing stairs? 0  Dressing or bathing? 0  Doing errands, shopping? 0  Preparing Food and eating ? N  Using the Toilet? N  In the past six months, have you accidently leaked urine? Y  Do you have problems with loss of bowel control? N  Managing your Medications? N  Managing your Finances? N  Housekeeping or managing your Housekeeping? N    Patient Care Team: [place], Manley Seeds, MD as PCP - General (Family Medicine) Pa, [facility]  Indicate any recent Medical Services you may have received from other than [facility] providers in the past year (date may be approximate).     Assessment:    This is a routine wellness examination for [facility].  Hearing/Vision screen Hearing Screening - Comments:: Pt says her hearing is good Vision Screening - Comments:: Pt says her vision is good w/contacts Dr Maudine [facility]   Goals Addressed             This Visit's Progress    Patient Stated       I want to lose weight and get my A1C down       Depression Screen     09/29/2023   10:23 AM 07/12/2023   10:09 AM 06/15/2023   11:06 AM  PHQ 2/9 Scores  PHQ - 2 Score 0 3 2  PHQ- 9 Score  9 7    Fall Risk     09/29/2023   10:18 AM 07/12/2023   10:09 AM 06/15/2023   11:06 AM  Fall Risk   Falls in the past year? 0 0 0  Number falls in past yr: 0 0 0  Injury with Fall? 0 0 0  Risk for fall due to : No Fall Risks No Fall Risks No Fall Risks  Follow up  Education provided;Falls prevention discussed Falls evaluation completed Falls evaluation completed    MEDICARE RISK AT HOME:  Medicare Risk at Home Any stairs in or around the home?: Yes If so, are there any without handrails?: Yes Home free of loose throw rugs in walkways, pet beds, electrical cords, etc?: Yes Adequate lighting in your home to reduce risk of falls?: Yes Life alert?: No Use of a cane, walker or w/c?: No Grab bars in the bathroom?: No Shower chair or bench in shower?: Yes Elevated toilet seat or a handicapped toilet?: Yes  TIMED UP AND GO:  Was the test performed?  No  Cognitive Function: 6CIT completed        09/29/2023   10:28 AM  6CIT Screen  What Year? 0 points  What month? 0 points  What time? 0 points  Count back from 20 0 points  Months in reverse 0 points  Repeat phrase 0 points  Total Score 0 points    Immunizations Immunization History  Administered Date(s) Administered   Influenza, Seasonal, Injecte, Preservative Fre 06/15/2023   PFIZER Comirnaty(Gray Top)Covid-19 Tri-Sucrose Vaccine 10/07/2019, 10/30/2019   Tdap 12/16/2019    Screening Tests Health Maintenance  Topic Date Due   FOOT EXAM  Never done   OPHTHALMOLOGY EXAM  Never done   Diabetic kidney evaluation - Urine ACR  Never done   Pneumococcal Vaccine 63-95 Years old (1 of 2 - PCV) Never done   COVID-19 Vaccine (3 - Pfizer risk series) 06/30/2024 (Originally 11/27/2019)   Zoster Vaccines- Shingrix (1 of 2) 09/12/2024 (Originally [birth year]01/1988)   HEMOGLOBIN A1C  12/13/2023   INFLUENZA VACCINE  12/15/2023   Diabetic kidney evaluation - eGFR measurement  06/14/2024   Medicare Annual Wellness (AWV)  09/28/2024   MAMMOGRAM  08/09/2025   DTaP/Tdap/Td (2 - Td  or Tdap) 12/15/2029   Colonoscopy  05/28/2030   Hepatitis C Screening  Completed   HIV Screening  Completed   HPV VACCINES  Aged Out   Meningococcal B Vaccine  Aged Out    Health Maintenance  Health Maintenance Due  Topic  Date Due   FOOT EXAM  Never done   OPHTHALMOLOGY EXAM  Never done   Diabetic kidney evaluation - Urine ACR  Never done   Pneumococcal Vaccine 58-37 Years old (1 of 2 - PCV) Never done   Health Maintenance Items Addressed: Pt indicates she is UTD w/eye appts. Pt has f/u appt w/PCP on 10/16/23. Health maintenance gaps to be closed at that time. None can be done at this time as televisit  Additional Screening:  Vision Screening: Recommended annual ophthalmology exams for early detection of glaucoma and other disorders of the eye.  Dental Screening: Recommended annual dental exams for proper oral hygiene  Community Resource Referral / Chronic Care Management: CRR required this visit?  No   CCM required this visit?  No   Plan:    I have personally reviewed and noted the following in the patient's chart:   Medical and social history Use of alcohol, tobacco or illicit drugs  Current medications and supplements including opioid prescriptions. Patient is not currently taking opioid prescriptions. Functional ability and status Nutritional status Physical activity Advanced directives List of other physicians Hospitalizations, surgeries, and ER visits in previous 12 months Vitals Screenings to include cognitive, depression, and falls Referrals and appointments  In addition, I have reviewed and discussed with patient certain preventive protocols, quality metrics, and best practice recommendations. A written personalized care plan for preventive services as well as general preventive health recommendations were provided to patient.   Nerissa Bannister, LPN   [id number]   After Visit Summary: (MyChart) Due to this being a telephonic visit, the after visit summary with patients personalized plan was offered to patient via MyChart   Notes: Nothing significant to report at this time.

## 2023-09-29 NOTE — Patient Instructions (Signed)
 Ms. Nicolls , Thank you for taking time out of your busy schedule to complete your Annual Wellness Visit with me. I enjoyed our conversation and look forward to speaking with you again next year. I, as well as your care team,  appreciate your ongoing commitment to your health goals. Please review the following plan we discussed and let me know if I can assist you in the future. Your Game plan/ To Do List  Follow up Visits: Next Medicare AWV with our clinical staff: 10/02/24 @ 10:50am   Have you seen your provider in the last 6 months (3 months if uncontrolled diabetes)? Yes Next Office Visit with your provider: 10/16/23  Clinician Recommendations:  Aim for 30 minutes of exercise or brisk walking, 6-8 glasses of water, and 5 servings of fruits and vegetables each day.       This is a list of the screening recommended for you and due dates:  Health Maintenance  Topic Date Due   Complete foot exam   Never done   Eye exam for diabetics  Never done   Yearly kidney health urinalysis for diabetes  Never done   Pneumococcal Vaccination (1 of 2 - PCV) Never done   Medicare Annual Wellness Visit  11/06/2020   COVID-19 Vaccine (3 - Pfizer risk series) 06/30/2024*   Zoster (Shingles) Vaccine (1 of 2) 09/12/2024*   Hemoglobin A1C  12/13/2023   Flu Shot  12/15/2023   Yearly kidney function blood test for diabetes  06/14/2024   Mammogram  08/09/2025   DTaP/Tdap/Td vaccine (2 - Td or Tdap) 12/15/2029   Colon Cancer Screening  05/28/2030   Hepatitis C Screening  Completed   HIV Screening  Completed   HPV Vaccine  Aged Out   Meningitis B Vaccine  Aged Out  *Topic was postponed. The date shown is not the original due date.    Advanced directives: (Declined) Advance directive discussed with you today. Even though you declined this today, please call our office should you change your mind, and we can give you the proper paperwork for you to fill out. Advance Care Planning is important because it:  [x]   Makes sure you receive the medical care that is consistent with your values, goals, and preferences  [x]  It provides guidance to your family and loved ones and reduces their decisional burden about whether or not they are making the right decisions based on your wishes.  Follow the link provided in your after visit summary or read over the paperwork we have mailed to you to help you started getting your Advance Directives in place. If you need assistance in completing these, please reach out to us  so that we can help you!

## 2023-10-16 ENCOUNTER — Ambulatory Visit: Payer: Self-pay | Admitting: Family Medicine

## 2023-10-16 ENCOUNTER — Ambulatory Visit (INDEPENDENT_AMBULATORY_CARE_PROVIDER_SITE_OTHER): Payer: Medicare HMO | Admitting: Family Medicine

## 2023-10-16 ENCOUNTER — Encounter: Payer: Self-pay | Admitting: Family Medicine

## 2023-10-16 VITALS — BP 128/86 | HR 75 | Temp 97.5°F | Ht 63.5 in | Wt 172.5 lb

## 2023-10-16 DIAGNOSIS — K760 Fatty (change of) liver, not elsewhere classified: Secondary | ICD-10-CM

## 2023-10-16 DIAGNOSIS — E1169 Type 2 diabetes mellitus with other specified complication: Secondary | ICD-10-CM

## 2023-10-16 DIAGNOSIS — G4733 Obstructive sleep apnea (adult) (pediatric): Secondary | ICD-10-CM | POA: Diagnosis not present

## 2023-10-16 DIAGNOSIS — D7389 Other diseases of spleen: Secondary | ICD-10-CM

## 2023-10-16 DIAGNOSIS — I10 Essential (primary) hypertension: Secondary | ICD-10-CM

## 2023-10-16 DIAGNOSIS — E119 Type 2 diabetes mellitus without complications: Secondary | ICD-10-CM

## 2023-10-16 DIAGNOSIS — E785 Hyperlipidemia, unspecified: Secondary | ICD-10-CM

## 2023-10-16 LAB — POCT GLYCOSYLATED HEMOGLOBIN (HGB A1C): Hemoglobin A1C: 6.8 % — AB (ref 4.0–5.6)

## 2023-10-16 LAB — MICROALBUMIN / CREATININE URINE RATIO
Creatinine,U: 267.1 mg/dL
Microalb Creat Ratio: 14 mg/g (ref 0.0–30.0)
Microalb, Ur: 3.7 mg/dL — ABNORMAL HIGH (ref 0.0–1.9)

## 2023-10-16 NOTE — Progress Notes (Signed)
 Subjective:    Patient ID: Joan Ochoa, female    DOB: 27-Sep-1970, 53 y.o.   MRN: [id number]  HPI  Wt Readings from Last 3 Encounters:  10/16/23 172 lb 8 oz (78.2 kg)  09/29/23 167 lb (75.8 kg)  08/07/23 175 lb 4 oz (79.5 kg)   30.08 kg/m  Vitals:   10/16/23 1020  BP: 128/86  Pulse: 75  Temp: (!) 97.5 F (36.4 C)  SpO2: 96%    Here for follow up of DM2 and HTN and chronic medical problems   Just got back from a cruise to [employer] it  Ate poorly but walked a lot    HTN bp is stable today  No cp or palpitations or headaches or edema  No side effects to medicines  BP Readings from Last 3 Encounters:  10/16/23 128/86  08/07/23 121/82  07/12/23 128/80    Losartan 100 mg daily   Hepatic steatosis  Lab Results  Component Value Date   ALT 50 (H) 08/07/2023   AST 33 08/07/2023   ALKPHOS 87 08/07/2023   BILITOT 0.7 08/07/2023   ALT came down from 65 to 50 last time  No etoh Rare acetaminophen or ibuprofen  On US  there was fatty liver change   DM2 Lab Results  Component Value Date   HGBA1C 6.8 (A) 10/16/2023   HGBA1C 7.4 (H) 06/15/2023   Improved  Last visit declined medication  Was working on weight loss   Declines medicine  Wants to keep working on diet and exercise   Cut out all added sugar  Fruit instead of sweets  Eating veggies  Cut out most of her pasta  Not much bread or potato   Working in yard and garden  Increased activity     Us  abd  IMPRESSION: *Diffuse fatty infiltration of the liver. *Hypoechoic nodule in the anterior margin of the mid upper pole of the spleen measures 1.0 x 0.7 x 0.8 cm. Significance unclear. It was not identified on the prior CT. Close follow-up ultrasound recommended.  Has another us  scheduled this summer for the spleen    Due for foot exam and microalb    Fibrosis 4 Score = .69 (Low risk)         Interpretation for patients with HCV          <1.45       -  F0-F1 (Low risk)           1.45-3.25 -  Indeterminate           >3.25      -  F3-F4 (High risk)     Validated for ages 75-65  Declines statin currently  Lab Results  Component Value Date   CHOL 220 (H) 06/15/2023   HDL 54.30 06/15/2023   LDLCALC 120 (H) 06/15/2023   TRIG 226.0 (H) 06/15/2023   CHOLHDL 4 06/15/2023      Patient Active Problem List   Diagnosis Date Noted   Nodule of spleen 07/13/2023   Hepatic steatosis 07/13/2023   Elevated ALT measurement 07/12/2023   Encounter for screening mammogram for breast cancer 07/12/2023   Routine general medical examination at a health care facility 07/12/2023   H/O malignant melanoma of back 06/15/2023   Encounter for screening for HIV 06/15/2023   Encounter for hepatitis C screening test for low risk patient 06/15/2023   Vitamin D  deficiency 06/15/2023   History of hypokalemia 06/15/2023   Colon cancer screening 06/15/2023   Anxiety  and depression 08/22/2017   Depression 08/22/2017   Hyperlipidemia associated with type 2 diabetes mellitus (HCC) 08/22/2017   Migraine without aura and without status migrainosus, not intractable 08/22/2017   New onset type 2 diabetes mellitus (HCC) 08/22/2017   PTSD (post-traumatic stress disorder) 08/22/2017   Essential hypertension 08/18/2017   GERD without esophagitis 08/18/2017   Past Medical History:  Diagnosis Date   Allergy    Anxiety    Most of my life as adult   Depression 05/13/2014   Diabetes mellitus without complication (HCC)    GERD (gastroesophageal reflux disease)    History of dysplastic nevus 06/02/2020   MODERATE TO SEVERELY ATYPICAL MOLE at left upper back medial/excision    Hypertension 2009   Sleep apnea 2024   Have cpap   Past Surgical History:  Procedure Laterality Date   ABDOMINAL HYSTERECTOMY  2008   APPENDECTOMY  1990   CHOLECYSTECTOMY  2013   NASAL SINUS SURGERY     Social History   Tobacco Use   Smoking status: Never   Smokeless tobacco: Never  Substance Use Topics   Alcohol  use: No   Drug use: Never   Family History  Problem Relation Age of Onset   Arthritis Mother    Depression Mother    Diabetes Mother    Hearing loss Mother    Hypertension Mother    Varicose Veins Mother    Cancer Father    COPD Father    Stroke Father    Depression Sister    Diabetes Sister    Hypertension Sister    Depression Sister    Diabetes Sister    Hypertension Sister    ADD / ADHD Daughter    ADD / ADHD Daughter    Breast cancer Paternal Aunt    Early death Maternal Grandmother    Allergies  Allergen Reactions   Codeine Nausea And Vomiting   Prednisone Other (See Comments)    Hallucinations , body turns real red ,hot and shortness of breath    Current Outpatient Medications on File Prior to Visit  Medication Sig Dispense Refill   Biotin 5000 MCG SUBL Place under the tongue.     Cholecalciferol (VITAMIN D -1000 MAX ST) 25 MCG (1000 UT) tablet Take by mouth.     clonazePAM (KLONOPIN) 0.25 MG disintegrating tablet Take 0.25 mg by mouth daily as needed.     desvenlafaxine (PRISTIQ) 50 MG 24 hr tablet Take 50 mg by mouth daily.     famotidine (PEPCID) 10 MG tablet Take 10 mg by mouth daily as needed for heartburn or indigestion.     fluticasone (FLONASE) 50 MCG/ACT nasal spray Place into the nose.     losartan (COZAAR) 100 MG tablet Take 1 tablet (100 mg total) by mouth daily. 90 tablet 1   potassium gluconate 595 (99 K) MG TABS tablet Take by mouth.     traZODone (DESYREL) 100 MG tablet Take 100 mg by mouth at bedtime as needed for sleep.     No current facility-administered medications on file prior to visit.    Review of Systems  Constitutional:  Negative for activity change, appetite change, fatigue, fever and unexpected weight change.  HENT:  Negative for congestion, ear pain, rhinorrhea, sinus pressure and sore throat.   Eyes:  Negative for pain, redness and visual disturbance.  Respiratory:  Negative for cough, shortness of breath and wheezing.    Cardiovascular:  Negative for chest pain and palpitations.  Gastrointestinal:  Negative for abdominal  pain, blood in stool, constipation and diarrhea.  Endocrine: Negative for polydipsia and polyuria.  Genitourinary:  Negative for dysuria, frequency and urgency.  Musculoskeletal:  Negative for arthralgias, back pain and myalgias.  Skin:  Negative for pallor and rash.  Allergic/Immunologic: Negative for environmental allergies.  Neurological:  Negative for dizziness, syncope and headaches.  Hematological:  Negative for adenopathy. Does not bruise/bleed easily.  Psychiatric/Behavioral:  Negative for decreased concentration and dysphoric mood. The patient is not nervous/anxious.        Objective:   Physical Exam Constitutional:      General: She is not in acute distress.    Appearance: Normal appearance. She is well-developed. She is obese. She is not ill-appearing or diaphoretic.  HENT:     Head: Normocephalic and atraumatic.  Eyes:     Conjunctiva/sclera: Conjunctivae normal.     Pupils: Pupils are equal, round, and reactive to light.  Neck:     Thyroid : No thyromegaly.     Vascular: No carotid bruit or JVD.  Cardiovascular:     Rate and Rhythm: Normal rate and regular rhythm.     Heart sounds: Normal heart sounds.     No gallop.  Pulmonary:     Effort: Pulmonary effort is normal. No respiratory distress.     Breath sounds: Normal breath sounds. No wheezing or rales.  Abdominal:     General: There is no distension or abdominal bruit.     Palpations: Abdomen is soft.     Tenderness: There is no abdominal tenderness.  Musculoskeletal:     Cervical back: Normal range of motion and neck supple.     Right lower leg: No edema.     Left lower leg: No edema.  Lymphadenopathy:     Cervical: No cervical adenopathy.  Skin:    General: Skin is warm and dry.     Coloration: Skin is not pale.     Findings: No rash.  Neurological:     Mental Status: She is alert.     Coordination:  Coordination normal.     Deep Tendon Reflexes: Reflexes are normal and symmetric. Reflexes normal.  Psychiatric:        Mood and Affect: Mood normal.           Assessment & Plan:   Problem List Items Addressed This Visit       Cardiovascular and Mediastinum   Essential hypertension   bp in fair control at this time  BP Readings from Last 1 Encounters:  10/16/23 128/86   No changes needed Most recent labs reviewed  Disc lifstyle change with low sodium diet and exercise  Continue losartan 100 mg daily        Digestive   Hepatic steatosis   Last ALT 50 -down trom 65 with lifestyle change  Reassuring fibrosis score   Fibrosis 4 Score = .69 (Low risk)        Interpretation for patients with NAFLD          <1.30       -  F0-F1 (Low risk)          1.30-2.67 -  Indeterminate           >2.67      -  F3-F4 (High risk)     Validated for ages 66-65                Endocrine   New onset type 2 diabetes mellitus (HCC) - Primary   Lab Results  Component Value Date   HGBA1C 6.8 (A) 10/16/2023   HGBA1C 7.4 (H) 06/15/2023   Improved with diet Declines medication/ metformin for now  Discussed lifestyle change  On arb  Microalb today  Declines statin  Normal foot exam Sent for eye exam   Follow up 3 mo with lab prior       Relevant Orders   POCT HgB A1C (Completed)   Microalbumin / creatinine urine ratio   Hyperlipidemia associated with type 2 diabetes mellitus (HCC)   Disc goals for lipids and reasons to control them Rev last labs with pt Rev low sat fat diet in detail Discussed recommendation for statin with diabetes She declines for now Info given   Will re check lipid 3 mo and discuss further at follow up  Aware has fatty liver         Other   Nodule of spleen   Has us  planned to re image this

## 2023-10-16 NOTE — Assessment & Plan Note (Signed)
 Has us  planned to re image this

## 2023-10-16 NOTE — Assessment & Plan Note (Addendum)
 bp in fair control at this time  BP Readings from Last 1 Encounters:  10/16/23 128/86   No changes needed Most recent labs reviewed  Disc lifstyle change with low sodium diet and exercise  Continue losartan 100 mg daily

## 2023-10-16 NOTE — Assessment & Plan Note (Signed)
 Lab Results  Component Value Date   HGBA1C 6.8 (A) 10/16/2023   HGBA1C 7.4 (H) 06/15/2023   Improved with diet Declines medication/ metformin for now  Discussed lifestyle change  On arb  Microalb today  Declines statin  Normal foot exam Sent for eye exam   Follow up 3 mo with lab prior

## 2023-10-16 NOTE — Assessment & Plan Note (Signed)
 Last ALT 50 -down trom 65 with lifestyle change  Reassuring fibrosis score   Fibrosis 4 Score = .69 (Low risk)        Interpretation for patients with NAFLD          <1.30       -  F0-F1 (Low risk)          1.30-2.67 -  Indeterminate           >2.67      -  F3-F4 (High risk)     Validated for ages 30-65

## 2023-10-16 NOTE — Assessment & Plan Note (Signed)
 Disc goals for lipids and reasons to control them Rev last labs with pt Rev low sat fat diet in detail Discussed recommendation for statin with diabetes She declines for now Info given   Will re check lipid 3 mo and discuss further at follow up  Aware has fatty liver

## 2023-10-16 NOTE — Patient Instructions (Addendum)
 Try to get most of your carbohydrates from produce (with the exception of white potatoes) and whole grains Eat less bread/pasta/rice/snack foods/cereals/sweets and other items from the middle of the grocery store (processed carbs)  Get started with exercise  Cardio- walk/pedal/swim/ dance   Add some strength training to your routine, this is important for bone and brain health and can reduce your risk of falls and help your body use insulin properly and regulate weight  Light weights, exercise bands , and internet videos are a good way to start  Yoga (chair or regular), machines , floor exercises or a gym with machines are also good options     It is recommended that all diabetics take a statin to help prevent vascular disease (even if cholesterol is not high)  Rosuvastatin (crestor) is my first choice   Follow up in 3 months with fasting labs prior

## 2023-10-19 ENCOUNTER — Telehealth: Payer: Self-pay | Admitting: *Deleted

## 2023-10-19 NOTE — Telephone Encounter (Signed)
 Copied from CRM [id number]. Topic: General - Other >> Oct 19, 2023  3:04 PM Deaijah H wrote: Reason for CRM: Brandy [facility] [place] would like to know id Dr. Malissa Se would like the ultrasound of just spleen or abdomen complete. Callback [phone]

## 2023-10-19 NOTE — Telephone Encounter (Signed)
 Just spleen would actually be great, thanks

## 2023-10-20 NOTE — Telephone Encounter (Signed)
 Brandy notified of Dr. Belva Boyden comments

## 2023-10-24 ENCOUNTER — Other Ambulatory Visit

## 2023-10-24 ENCOUNTER — Ambulatory Visit
Admission: RE | Admit: 2023-10-24 | Discharge: 2023-10-24 | Disposition: A | Discharge status: Home / Self Care | Source: Ambulatory Visit | Attending: Family Medicine | Admitting: Family Medicine

## 2023-10-24 DIAGNOSIS — D739 Disease of spleen, unspecified: Secondary | ICD-10-CM | POA: Diagnosis not present

## 2023-10-24 DIAGNOSIS — D7389 Other diseases of spleen: Secondary | ICD-10-CM

## 2023-10-28 ENCOUNTER — Ambulatory Visit: Payer: Self-pay | Admitting: Family Medicine

## 2023-10-28 DIAGNOSIS — D7389 Other diseases of spleen: Secondary | ICD-10-CM

## 2023-11-09 ENCOUNTER — Ambulatory Visit
Admission: RE | Admit: 2023-11-09 | Discharge: 2023-11-09 | Disposition: A | Discharge status: Home / Self Care | Source: Ambulatory Visit | Attending: Family Medicine | Admitting: Family Medicine

## 2023-11-09 DIAGNOSIS — D7389 Other diseases of spleen: Secondary | ICD-10-CM

## 2023-11-09 DIAGNOSIS — K7689 Other specified diseases of liver: Secondary | ICD-10-CM | POA: Diagnosis not present

## 2023-11-09 MED ORDER — GADOPICLENOL 0.5 MMOL/ML IV SOLN
7.5000 mL | Freq: Once | INTRAVENOUS | Status: AC | PRN
  Administered 2023-11-09: 7.5 mL via INTRAVENOUS

## 2023-11-12 ENCOUNTER — Ambulatory Visit: Payer: Self-pay | Admitting: Family Medicine

## 2023-11-12 DIAGNOSIS — D7389 Other diseases of spleen: Secondary | ICD-10-CM

## 2023-11-12 DIAGNOSIS — K760 Fatty (change of) liver, not elsewhere classified: Secondary | ICD-10-CM

## 2023-11-12 NOTE — Assessment & Plan Note (Signed)
 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 

## 2023-11-15 DIAGNOSIS — G4733 Obstructive sleep apnea (adult) (pediatric): Secondary | ICD-10-CM | POA: Diagnosis not present

## 2023-11-16 DIAGNOSIS — D2262 Melanocytic nevi of left upper limb, including shoulder: Secondary | ICD-10-CM | POA: Diagnosis not present

## 2023-11-16 DIAGNOSIS — D2272 Melanocytic nevi of left lower limb, including hip: Secondary | ICD-10-CM | POA: Diagnosis not present

## 2023-11-16 DIAGNOSIS — Z86006 Personal history of melanoma in-situ: Secondary | ICD-10-CM | POA: Diagnosis not present

## 2023-11-16 DIAGNOSIS — D2261 Melanocytic nevi of right upper limb, including shoulder: Secondary | ICD-10-CM | POA: Diagnosis not present

## 2023-11-16 DIAGNOSIS — D225 Melanocytic nevi of trunk: Secondary | ICD-10-CM | POA: Diagnosis not present

## 2023-12-05 DIAGNOSIS — F4310 Post-traumatic stress disorder, unspecified: Secondary | ICD-10-CM | POA: Diagnosis not present

## 2023-12-05 DIAGNOSIS — G4733 Obstructive sleep apnea (adult) (pediatric): Secondary | ICD-10-CM | POA: Diagnosis not present

## 2023-12-05 DIAGNOSIS — Z1331 Encounter for screening for depression: Secondary | ICD-10-CM | POA: Diagnosis not present

## 2023-12-05 DIAGNOSIS — G43E19 Chronic migraine with aura, intractable, without status migrainosus: Secondary | ICD-10-CM | POA: Diagnosis not present

## 2023-12-29 DIAGNOSIS — F411 Generalized anxiety disorder: Secondary | ICD-10-CM | POA: Diagnosis not present

## 2023-12-29 DIAGNOSIS — F331 Major depressive disorder, recurrent, moderate: Secondary | ICD-10-CM | POA: Diagnosis not present

## 2023-12-29 DIAGNOSIS — F4312 Post-traumatic stress disorder, chronic: Secondary | ICD-10-CM | POA: Diagnosis not present

## 2024-01-10 ENCOUNTER — Other Ambulatory Visit (INDEPENDENT_AMBULATORY_CARE_PROVIDER_SITE_OTHER)

## 2024-01-10 DIAGNOSIS — E785 Hyperlipidemia, unspecified: Secondary | ICD-10-CM | POA: Diagnosis not present

## 2024-01-10 DIAGNOSIS — K760 Fatty (change of) liver, not elsewhere classified: Secondary | ICD-10-CM

## 2024-01-10 DIAGNOSIS — E119 Type 2 diabetes mellitus without complications: Secondary | ICD-10-CM

## 2024-01-10 DIAGNOSIS — I10 Essential (primary) hypertension: Secondary | ICD-10-CM | POA: Diagnosis not present

## 2024-01-10 DIAGNOSIS — E1169 Type 2 diabetes mellitus with other specified complication: Secondary | ICD-10-CM | POA: Diagnosis not present

## 2024-01-10 LAB — HEPATIC FUNCTION PANEL
ALT: 39 U/L — ABNORMAL HIGH (ref 0–35)
AST: 24 U/L (ref 0–37)
Albumin: 4.6 g/dL (ref 3.5–5.2)
Alkaline Phosphatase: 76 U/L (ref 39–117)
Bilirubin, Direct: 0.1 mg/dL (ref 0.0–0.3)
Total Bilirubin: 0.5 mg/dL (ref 0.2–1.2)
Total Protein: 7.2 g/dL (ref 6.0–8.3)

## 2024-01-10 LAB — BASIC METABOLIC PANEL WITH GFR
BUN: 12 mg/dL (ref 6–23)
CO2: 27 meq/L (ref 19–32)
Calcium: 9.3 mg/dL (ref 8.4–10.5)
Chloride: 101 meq/L (ref 96–112)
Creatinine, Ser: 0.93 mg/dL (ref 0.40–1.20)
GFR: 69.8 mL/min (ref >60.00)
Glucose, Bld: 166 mg/dL — ABNORMAL HIGH (ref 70–99)
Potassium: 4.1 meq/L (ref 3.5–5.1)
Sodium: 139 meq/L (ref 135–145)

## 2024-01-10 LAB — LIPID PANEL
Cholesterol: 256 mg/dL — ABNORMAL HIGH (ref 0–200)
HDL: 56.2 mg/dL (ref >39.00)
LDL Cholesterol: 162 mg/dL — ABNORMAL HIGH (ref 0–99)
NonHDL: 199.49
Total CHOL/HDL Ratio: 5
Triglycerides: 189 mg/dL — ABNORMAL HIGH (ref 0.0–149.0)
VLDL: 37.8 mg/dL (ref 0.0–40.0)

## 2024-01-10 LAB — HEMOGLOBIN A1C: Hgb A1c MFr Bld: 7.4 % — ABNORMAL HIGH (ref 4.6–6.5)

## 2024-01-17 ENCOUNTER — Ambulatory Visit: Admitting: Family Medicine

## 2024-01-26 ENCOUNTER — Ambulatory Visit: Admitting: Family Medicine

## 2024-02-01 ENCOUNTER — Ambulatory Visit (INDEPENDENT_AMBULATORY_CARE_PROVIDER_SITE_OTHER): Admitting: Family Medicine

## 2024-02-01 ENCOUNTER — Encounter: Payer: Self-pay | Admitting: Family Medicine

## 2024-02-01 VITALS — BP 129/80 | HR 98 | Temp 97.6°F | Ht 63.5 in | Wt 173.1 lb

## 2024-02-01 DIAGNOSIS — R7401 Elevation of levels of liver transaminase levels: Secondary | ICD-10-CM

## 2024-02-01 DIAGNOSIS — Z23 Encounter for immunization: Secondary | ICD-10-CM | POA: Diagnosis not present

## 2024-02-01 DIAGNOSIS — I10 Essential (primary) hypertension: Secondary | ICD-10-CM

## 2024-02-01 DIAGNOSIS — E1169 Type 2 diabetes mellitus with other specified complication: Secondary | ICD-10-CM | POA: Diagnosis not present

## 2024-02-01 DIAGNOSIS — K760 Fatty (change of) liver, not elsewhere classified: Secondary | ICD-10-CM

## 2024-02-01 DIAGNOSIS — Z79899 Other long term (current) drug therapy: Secondary | ICD-10-CM

## 2024-02-01 DIAGNOSIS — E119 Type 2 diabetes mellitus without complications: Secondary | ICD-10-CM

## 2024-02-01 DIAGNOSIS — E785 Hyperlipidemia, unspecified: Secondary | ICD-10-CM | POA: Diagnosis not present

## 2024-02-01 NOTE — Patient Instructions (Signed)
 Use a pill box to not miss medicine  Also reminders and alarms   Read about metformin  Let us  know if/when ready  This is for blood sugar control and also helps weight loss   In the future I would like to start a statin medicine for cholesterol and vascular protection  Crestor (rosuvastatin) is the one I usually start with   Keep up the great work with diet and exercise   Flu shot today  Follow up in 3 months

## 2024-02-01 NOTE — Assessment & Plan Note (Signed)
 Lab Results  Component Value Date   HGBA1C 7.4 (H) 01/10/2024   HGBA1C 6.8 (A) 10/16/2023   HGBA1C 7.4 (H) 06/15/2023   Just started with better diet /exerise 2 wk ago  Suspect A1c will be bettter next time Discussed treatment opt -given handout on metformin xr and she will let us  know if interested after review Oph utd Microal utd On losartan Discussed starting statin

## 2024-02-01 NOTE — Assessment & Plan Note (Signed)
 Working on diet/exercise 2 weeks Improved labs Continue to watch Lab Results  Component Value Date   ALT 39 (H) 01/10/2024   AST 24 01/10/2024   ALKPHOS 76 01/10/2024   BILITOT 0.5 01/10/2024    Fibrosis 4 Score = .57 (Low risk)        Interpretation for patients with NAFLD          <1.30       -  F0-F1 (Low risk)          1.30-2.67 -  Indeterminate           >2.67      -  F3-F4 (High risk)     Validated for ages 65-65      Score is based on outdated labs. ALT, AST, and platelets should all be measured within the last 6 months for an accurate FIB-4 Score

## 2024-02-01 NOTE — Assessment & Plan Note (Signed)
 bp in fair control at this time  BP Readings from Last 1 Encounters:  02/01/24 129/80   No changes needed Most recent labs reviewed  Disc lifstyle change with low sodium diet and exercise  Losartan 100 mg daily  Just got back from gym and blood pressure is generally lower  Follow up 3 mo

## 2024-02-01 NOTE — Progress Notes (Signed)
 Subjective:    Patient ID: Joan Ochoa, female    DOB: [birth year], 53 y.o.   MRN: [id number]  HPI  Wt Readings from Last 3 Encounters:  02/01/24 173 lb 2 oz (78.5 kg)  10/16/23 172 lb 8 oz (78.2 kg)  09/29/23 167 lb (75.8 kg)   30.19 kg/m  Vitals:   02/01/24 0958 02/01/24 1018  BP: (!) 144/86 129/80  Pulse: 98   Temp: 97.6 F (36.4 C)   SpO2: 97%     Pt presents for follow up of chronic health problems DM2 HTN Hyperlipidemia Fatty liver  Needs flu shot   Going through a separation-hard time  Was eating emotionally  Better hold on it now  May call her counselor   More support in her life  Change is hard   Now wants to be healthier    HTN Just came back from the gym  bp is stable today  No cp or palpitations or headaches or edema  No side effects to medicines  BP Readings from Last 3 Encounters:  02/01/24 129/80  10/16/23 128/86  08/07/23 121/82     Lab Results  Component Value Date   NA 139 01/10/2024   K 4.1 01/10/2024   CO2 27 01/10/2024   GLUCOSE 166 (H) 01/10/2024   BUN 12 01/10/2024   CREATININE 0.93 01/10/2024   CALCIUM 9.3 01/10/2024   GFR 69.80 01/10/2024   GFRNONAA >60 06/27/2015   Losartan 100 mg daily   DM2 Diabetes Home sugar results   DM diet -was bad for a while  Now 2 weeks of eating well   Exercise  Routine at gym HIIT with elliptical  Some machines/ upper and lower and abs     Lab Results  Component Value Date   HGBA1C 7.4 (H) 01/10/2024   HGBA1C 6.8 (A) 10/16/2023   HGBA1C 7.4 (H) 06/15/2023   Lab Results  Component Value Date   MICROALBUR 3.7 (H) 10/16/2023    Renal protection arb  Last eye exam    Hepatic steatosis  Lab Results  Component Value Date   ALT 39 (H) 01/10/2024   AST 24 01/10/2024   ALKPHOS 76 01/10/2024   BILITOT 0.5 01/10/2024     Fibrosis 4 Score = .57 (Low risk)        Interpretation for patients with NAFLD          <1.30       -  F0-F1 (Low risk)          1.30-2.67 -   Indeterminate           >2.67      -  F3-F4 (High risk)     Validated for ages 56-65      Score is based on outdated labs. ALT, AST, and platelets should all be measured within the last 6 months for an accurate FIB-4 Score    Cholesterol Lab Results  Component Value Date   CHOL 256 (H) 01/10/2024   CHOL 220 (H) 06/15/2023   Lab Results  Component Value Date   HDL 56.20 01/10/2024   HDL 54.30 06/15/2023   Lab Results  Component Value Date   LDLCALC 162 (H) 01/10/2024   LDLCALC 120 (H) 06/15/2023   Lab Results  Component Value Date   TRIG 189.0 (H) 01/10/2024   TRIG 226.0 (H) 06/15/2023   Lab Results  Component Value Date   CHOLHDL 5 01/10/2024   CHOLHDL 4 06/15/2023   No results found for: LDLDIRECT  Has declined statin     Patient Active Problem List   Diagnosis Date Noted   Lesion of spleen 07/13/2023   Hepatic steatosis 07/13/2023   Elevated ALT measurement 07/12/2023   Encounter for screening mammogram for breast cancer 07/12/2023   Routine general medical examination at a health care facility 07/12/2023   H/O malignant melanoma of back 06/15/2023   Encounter for screening for HIV 06/15/2023   Encounter for hepatitis C screening test for low risk patient 06/15/2023   Vitamin D  deficiency 06/15/2023   History of hypokalemia 06/15/2023   Colon cancer screening 06/15/2023   Anxiety and depression 08/22/2017   Depression 08/22/2017   Hyperlipidemia associated with type 2 diabetes mellitus (HCC) 08/22/2017   Migraine without aura and without status migrainosus, not intractable 08/22/2017   New onset type 2 diabetes mellitus (HCC) 08/22/2017   PTSD (post-traumatic stress disorder) 08/22/2017   Essential hypertension 08/18/2017   GERD without esophagitis 08/18/2017   Past Medical History:  Diagnosis Date   Allergy    Anxiety    Most of my life as adult   Depression 05/13/2014   Diabetes mellitus without complication (HCC)    GERD (gastroesophageal  reflux disease)    History of dysplastic nevus 06/02/2020   MODERATE TO SEVERELY ATYPICAL MOLE at left upper back medial/excision    Hypertension 2009   Sleep apnea 2024   Have cpap   Past Surgical History:  Procedure Laterality Date   ABDOMINAL HYSTERECTOMY  2008   APPENDECTOMY  1990   CHOLECYSTECTOMY  2013   NASAL SINUS SURGERY     Social History   Tobacco Use   Smoking status: Never   Smokeless tobacco: Never  Substance Use Topics   Alcohol use: No   Drug use: Never   Family History  Problem Relation Age of Onset   Arthritis Mother    Depression Mother    Diabetes Mother    Hearing loss Mother    Hypertension Mother    Varicose Veins Mother    Cancer Father    COPD Father    Stroke Father    Depression Sister    Diabetes Sister    Hypertension Sister    Depression Sister    Diabetes Sister    Hypertension Sister    ADD / ADHD Daughter    ADD / ADHD Daughter    Breast cancer Paternal Aunt    Early death Maternal Grandmother    Allergies  Allergen Reactions   Codeine Nausea And Vomiting   Prednisone Other (See Comments)    Hallucinations , body turns real red ,hot and shortness of breath    Current Outpatient Medications on File Prior to Visit  Medication Sig Dispense Refill   Biotin 5000 MCG SUBL Place under the tongue.     Cholecalciferol (VITAMIN D -1000 MAX ST) 25 MCG (1000 UT) tablet Take by mouth.     clonazePAM (KLONOPIN) 0.25 MG disintegrating tablet Take 0.25 mg by mouth daily as needed.     desvenlafaxine (PRISTIQ) 50 MG 24 hr tablet Take 50 mg by mouth daily.     famotidine (PEPCID) 10 MG tablet Take 10 mg by mouth daily as needed for heartburn or indigestion.     fluticasone (FLONASE) 50 MCG/ACT nasal spray Place into the nose.     losartan (COZAAR) 100 MG tablet Take 1 tablet (100 mg total) by mouth daily. 90 tablet 1   potassium gluconate 595 (99 K) MG TABS tablet Take by mouth.  traZODone (DESYREL) 100 MG tablet Take 100 mg by mouth at  bedtime as needed for sleep.     No current facility-administered medications on file prior to visit.    Review of Systems  Constitutional:  Negative for activity change, appetite change, fatigue, fever and unexpected weight change.  HENT:  Negative for congestion, ear pain, rhinorrhea, sinus pressure and sore throat.   Eyes:  Negative for pain, redness and visual disturbance.  Respiratory:  Negative for cough, shortness of breath and wheezing.   Cardiovascular:  Negative for chest pain and palpitations.  Gastrointestinal:  Negative for abdominal pain, blood in stool, constipation and diarrhea.  Endocrine: Negative for polydipsia and polyuria.  Genitourinary:  Negative for dysuria, frequency and urgency.  Musculoskeletal:  Negative for arthralgias, back pain and myalgias.  Skin:  Negative for pallor and rash.  Allergic/Immunologic: Negative for environmental allergies.  Neurological:  Negative for dizziness, syncope and headaches.  Hematological:  Negative for adenopathy. Does not bruise/bleed easily.  Psychiatric/Behavioral:  Negative for decreased concentration and dysphoric mood. The patient is nervous/anxious.        Stressors noted        Objective:   Physical Exam Constitutional:      General: She is not in acute distress.    Appearance: Normal appearance. She is well-developed. She is obese. She is not ill-appearing or diaphoretic.  HENT:     Head: Normocephalic and atraumatic.  Eyes:     Conjunctiva/sclera: Conjunctivae normal.     Pupils: Pupils are equal, round, and reactive to light.  Neck:     Thyroid : No thyromegaly.     Vascular: No carotid bruit or JVD.  Cardiovascular:     Rate and Rhythm: Normal rate and regular rhythm.     Heart sounds: Normal heart sounds.     No gallop.  Pulmonary:     Effort: Pulmonary effort is normal. No respiratory distress.     Breath sounds: Normal breath sounds. No wheezing or rales.  Abdominal:     General: There is no  distension or abdominal bruit.     Palpations: Abdomen is soft.  Musculoskeletal:     Cervical back: Normal range of motion and neck supple.     Right lower leg: No edema.     Left lower leg: No edema.  Lymphadenopathy:     Cervical: No cervical adenopathy.  Skin:    General: Skin is warm and dry.     Coloration: Skin is not pale.     Findings: No rash.  Neurological:     Mental Status: She is alert.     Coordination: Coordination normal.     Deep Tendon Reflexes: Reflexes are normal and symmetric. Reflexes normal.  Psychiatric:        Mood and Affect: Mood normal.           Assessment & Plan:   Problem List Items Addressed This Visit       Cardiovascular and Mediastinum   Essential hypertension   bp in fair control at this time  BP Readings from Last 1 Encounters:  02/01/24 129/80   No changes needed Most recent labs reviewed  Disc lifstyle change with low sodium diet and exercise  Losartan 100 mg daily  Just got back from gym and blood pressure is generally lower  Follow up 3 mo        Digestive   Hepatic steatosis   Working on diet/exercise 2 weeks Improved labs Continue to watch Lab  Results  Component Value Date   ALT 39 (H) 01/10/2024   AST 24 01/10/2024   ALKPHOS 76 01/10/2024   BILITOT 0.5 01/10/2024    Fibrosis 4 Score = .57 (Low risk)        Interpretation for patients with NAFLD          <1.30       -  F0-F1 (Low risk)          1.30-2.67 -  Indeterminate           >2.67      -  F3-F4 (High risk)     Validated for ages 22-65      Score is based on outdated labs. ALT, AST, and platelets should all be measured within the last 6 months for an accurate FIB-4 Score           Endocrine   New onset type 2 diabetes mellitus (HCC) - Primary   Lab Results  Component Value Date   HGBA1C 7.4 (H) 01/10/2024   HGBA1C 6.8 (A) 10/16/2023   HGBA1C 7.4 (H) 06/15/2023   Just started with better diet /exerise 2 wk ago  Suspect A1c will be bettter  next time Discussed treatment opt -given handout on metformin xr and she will let us  know if interested after review Oph utd Microal utd On losartan Discussed starting statin       Hyperlipidemia associated with type 2 diabetes mellitus (HCC)   Disc goals for lipids and reasons to control them Rev last labs with pt Rev low sat fat diet in detail  LDL in 160s Pt is considering statin-not ready to commit  Discussed reason for treating in setting of diabetes   Working on diet now for 2 weeks  Follow up 3 mo         Other   Elevated ALT measurement   Improved Fatty liver Working on lifestyle change       Other Visit Diagnoses       Current use of proton pump inhibitor         Need for influenza vaccination       Relevant Orders   Flu vaccine trivalent PF, 6mos and older(Flulaval,Afluria,Fluarix,Fluzone) (Completed)

## 2024-02-01 NOTE — Assessment & Plan Note (Signed)
 Disc goals for lipids and reasons to control them Rev last labs with pt Rev low sat fat diet in detail  LDL in 160s Pt is considering statin-not ready to commit  Discussed reason for treating in setting of diabetes   Working on diet now for 2 weeks  Follow up 3 mo

## 2024-02-01 NOTE — Assessment & Plan Note (Signed)
 Improved Fatty liver Working on lifestyle change

## 2024-03-15 ENCOUNTER — Ambulatory Visit: Payer: Self-pay

## 2024-03-15 NOTE — Telephone Encounter (Signed)
 FYI Only or Action Required?: FYI only for provider: appointment scheduled on 03/19/2024.  Patient was last seen in primary care on 02/01/2024 by Randeen Laine LABOR, MD.  Called Nurse Triage reporting Hypertension.  Symptoms began today.  Interventions attempted: OTC medications: Tylenol.  Symptoms are: gradually worsening.  Triage Disposition: See PCP When Office is Open (Within 3 Days)  Patient/caregiver understands and will follow disposition?: Yes     CRM not created call from [facility] [place] CAL line. Pt reporting elevated BP   Reason for Disposition . Systolic BP >= 160 OR Diastolic >= 100  Answer Assessment - Initial Assessment Questions 1. BLOOD PRESSURE: What is your blood pressure? Did you take at least two measurements 5 minutes apart?     160/94; 163/100 taken while on call  2. ONSET: When did you take your blood pressure?     1045 am  3. HOW: How did you take your blood pressure? (e.g., automatic home BP monitor, visiting nurse)     Automatic home BP  4. HISTORY: Do you have a history of high blood pressure?     Yes  5. MEDICINES: Are you taking any medicines for blood pressure? Have you missed any doses recently?    Yes currently taking BP meds, denies missing any recent doses  6. OTHER SYMPTOMS: Do you have any symptoms? (e.g., blurred vision, chest pain, difficulty breathing, headache, weakness)     Mild chest pain states she he anxiety, headache, fatigue    Denies SOB or weakness states she feels like she is in a tunnel. Urged to go to ED if symptoms worsen at all. Pt verbalized understanding.  Protocols used: Blood Pressure - High-A-AH

## 2024-03-15 NOTE — Telephone Encounter (Signed)
 Appt scheduled with Dr. Randeen on 03/19/24

## 2024-03-15 NOTE — Telephone Encounter (Signed)
 Will see patient then Agree with ER and UC precautions

## 2024-03-19 ENCOUNTER — Encounter: Payer: Self-pay | Admitting: Family Medicine

## 2024-03-19 ENCOUNTER — Ambulatory Visit (INDEPENDENT_AMBULATORY_CARE_PROVIDER_SITE_OTHER): Admitting: Family Medicine

## 2024-03-19 VITALS — BP 136/82 | HR 90 | Temp 98.5°F | Ht 63.5 in | Wt 170.0 lb

## 2024-03-19 DIAGNOSIS — I10 Essential (primary) hypertension: Secondary | ICD-10-CM | POA: Diagnosis not present

## 2024-03-19 DIAGNOSIS — K219 Gastro-esophageal reflux disease without esophagitis: Secondary | ICD-10-CM

## 2024-03-19 MED ORDER — OLMESARTAN MEDOXOMIL 40 MG PO TABS: 40.0000 mg | ORAL_TABLET | Freq: Every day | ORAL | 0 refills | Status: DC

## 2024-03-19 MED ORDER — OMEPRAZOLE 20 MG PO CPDR: 20.0000 mg | DELAYED_RELEASE_CAPSULE | Freq: Every day | ORAL | 1 refills | Status: AC

## 2024-03-19 NOTE — Assessment & Plan Note (Signed)
 Blood pressure is up intermittently last few months  Highest 160s 100s (when relaxed)  Some stressors but pt feels at peace overall  Is checking appropriately  Working on diet/exercise and lost 10 lb on her home scale  Some headache/occational light headed and fatigue  Blood pressure here is lower than at home  BP: 136/82    Discussed option of changing her arb or adding med (amlodipine)  Pt would rather try different med in class rather than adding  Is diabetic and needs renal protection   Will change losartan 100 to olmesartan 40 mg daily  Instructed to call if side effects  Follow up 2-3 wk  If not improved add med (amlodipine or diuretic)

## 2024-03-19 NOTE — Progress Notes (Signed)
 Subjective:    Patient ID: Joan Ochoa, female    DOB: 16-Jan-1971, 53 y.o.   MRN: [id number]  HPI  Wt Readings from Last 3 Encounters:  03/19/24 170 lb (77.1 kg)  02/01/24 173 lb 2 oz (78.5 kg)  10/16/23 172 lb 8 oz (78.2 kg)   29.64 kg/m  Vitals:   03/19/24 0833  BP: 136/82  Pulse: 90  Temp: 98.5 F (36.9 C)  SpO2: 95%     Pt presents with c/o  Elevated blood pressure  GERD  Blood pressure has been higher in past several months   Pt called on 10/31 Noted blood pressure up to 163/100 Felt like she was in a tunnel  Readings  150s-160s systolic   Anxiety  (got separated from spouse in sept)  Thinks she is dealing well with it   Headaches on /off, a bit light headed also  Not feeling right  Fatigue     HTN bp is stable today  No cp or palpitations or headaches or edema  No side effects to medicines  BP Readings from Last 3 Encounters:  03/19/24 136/82  02/01/24 129/80  10/16/23 128/86    Losartan 100 mg daily  Blood pressure is good now but higher at home  Thinks her cuff is accurate     Lab Results  Component Value Date   NA 139 01/10/2024   K 4.1 01/10/2024   CO2 27 01/10/2024   GLUCOSE 166 (H) 01/10/2024   BUN 12 01/10/2024   CREATININE 0.93 01/10/2024   CALCIUM 9.3 01/10/2024   GFR 69.80 01/10/2024   GFRNONAA >60 06/27/2015   GERD- is worse  Tried to come off of omeprazole  and now on pepcid  Heartburn and burping    Patient Active Problem List   Diagnosis Date Noted   Lesion of spleen 07/13/2023   Hepatic steatosis 07/13/2023   Elevated ALT measurement 07/12/2023   Encounter for screening mammogram for breast cancer 07/12/2023   Routine general medical examination at a health care facility 07/12/2023   H/O malignant melanoma of back 06/15/2023   Encounter for screening for HIV 06/15/2023   Encounter for hepatitis C screening test for low risk patient 06/15/2023   Vitamin D  deficiency 06/15/2023   History of hypokalemia  06/15/2023   Colon cancer screening 06/15/2023   Anxiety and depression 08/22/2017   Depression 08/22/2017   Hyperlipidemia associated with type 2 diabetes mellitus (HCC) 08/22/2017   Migraine without aura and without status migrainosus, not intractable 08/22/2017   New onset type 2 diabetes mellitus (HCC) 08/22/2017   PTSD (post-traumatic stress disorder) 08/22/2017   Essential hypertension 08/18/2017   GERD without esophagitis 08/18/2017   Past Medical History:  Diagnosis Date   Allergy    Anxiety    Most of my life as adult   Depression 05/13/2014   Diabetes mellitus without complication (HCC)    GERD (gastroesophageal reflux disease)    History of dysplastic nevus 06/02/2020   MODERATE TO SEVERELY ATYPICAL MOLE at left upper back medial/excision    Hypertension 2009   Sleep apnea 2024   Have cpap   Past Surgical History:  Procedure Laterality Date   ABDOMINAL HYSTERECTOMY  2008   APPENDECTOMY  1990   CHOLECYSTECTOMY  2013   NASAL SINUS SURGERY     Social History   Tobacco Use   Smoking status: Never   Smokeless tobacco: Never  Substance Use Topics   Alcohol use: No   Drug use: Never  Family History  Problem Relation Age of Onset   Arthritis Mother    Depression Mother    Diabetes Mother    Hearing loss Mother    Hypertension Mother    Varicose Veins Mother    Cancer Father    COPD Father    Stroke Father    Depression Sister    Diabetes Sister    Hypertension Sister    Depression Sister    Diabetes Sister    Hypertension Sister    ADD / ADHD Daughter    ADD / ADHD Daughter    Breast cancer Paternal Aunt    Early death Maternal Grandmother    Allergies  Allergen Reactions   Codeine Nausea And Vomiting   Prednisone Other (See Comments)    Hallucinations , body turns real red ,hot and shortness of breath    Current Outpatient Medications on File Prior to Visit  Medication Sig Dispense Refill   Biotin 5000 MCG SUBL Place under the tongue.      Cholecalciferol (VITAMIN D -1000 MAX ST) 25 MCG (1000 UT) tablet Take by mouth.     clonazePAM (KLONOPIN) 0.25 MG disintegrating tablet Take 0.25 mg by mouth daily as needed.     desvenlafaxine (PRISTIQ) 50 MG 24 hr tablet Take 50 mg by mouth daily.     famotidine (PEPCID) 10 MG tablet Take 10 mg by mouth daily as needed for heartburn or indigestion.     fluticasone (FLONASE) 50 MCG/ACT nasal spray Place into the nose.     potassium gluconate 595 (99 K) MG TABS tablet Take by mouth.     traZODone (DESYREL) 100 MG tablet Take 100 mg by mouth at bedtime as needed for sleep.     No current facility-administered medications on file prior to visit.    Review of Systems  Constitutional:  Positive for fatigue. Negative for activity change, appetite change, fever and unexpected weight change.  HENT:  Negative for congestion, ear pain, rhinorrhea, sinus pressure and sore throat.   Eyes:  Negative for pain, redness and visual disturbance.  Respiratory:  Negative for cough, shortness of breath and wheezing.   Cardiovascular:  Negative for chest pain and palpitations.  Gastrointestinal:  Negative for abdominal pain, blood in stool, constipation and diarrhea.  Endocrine: Negative for polydipsia and polyuria.  Genitourinary:  Negative for dysuria, frequency and urgency.  Musculoskeletal:  Negative for arthralgias, back pain and myalgias.  Skin:  Negative for pallor and rash.  Allergic/Immunologic: Negative for environmental allergies.  Neurological:  Positive for light-headedness and headaches. Negative for dizziness and syncope.  Hematological:  Negative for adenopathy. Does not bruise/bleed easily.  Psychiatric/Behavioral:  Negative for decreased concentration and dysphoric mood. The patient is not nervous/anxious.        Thinks she is doing well with stress        Objective:   Physical Exam Constitutional:      General: She is not in acute distress.    Appearance: Normal appearance. She is  well-developed. She is not ill-appearing or diaphoretic.     Comments: Overweight   HENT:     Head: Normocephalic and atraumatic.  Eyes:     Conjunctiva/sclera: Conjunctivae normal.     Pupils: Pupils are equal, round, and reactive to light.  Neck:     Thyroid : No thyromegaly.     Vascular: No carotid bruit or JVD.  Cardiovascular:     Rate and Rhythm: Normal rate and regular rhythm.     Heart sounds:  Normal heart sounds.     No gallop.  Pulmonary:     Effort: Pulmonary effort is normal. No respiratory distress.     Breath sounds: Normal breath sounds. No wheezing or rales.  Abdominal:     General: There is no distension or abdominal bruit.     Palpations: Abdomen is soft.  Musculoskeletal:     Cervical back: Normal range of motion and neck supple.     Right lower leg: No edema.     Left lower leg: No edema.  Lymphadenopathy:     Cervical: No cervical adenopathy.  Skin:    General: Skin is warm and dry.     Coloration: Skin is not pale.     Findings: No rash.  Neurological:     Mental Status: She is alert.     Coordination: Coordination normal.     Deep Tendon Reflexes: Reflexes are normal and symmetric. Reflexes normal.  Psychiatric:        Mood and Affect: Mood normal.           Assessment & Plan:   Problem List Items Addressed This Visit       Cardiovascular and Mediastinum   Essential hypertension - Primary   Blood pressure is up intermittently last few months  Highest 160s 100s (when relaxed)  Some stressors but pt feels at peace overall  Is checking appropriately  Working on diet/exercise and lost 10 lb on her home scale  Some headache/occational light headed and fatigue  Blood pressure here is lower than at home  BP: 136/82    Discussed option of changing her arb or adding med (amlodipine)  Pt would rather try different med in class rather than adding  Is diabetic and needs renal protection   Will change losartan 100 to olmesartan 40 mg daily   Instructed to call if side effects  Follow up 2-3 wk  If not improved add med (amlodipine or diuretic)         Relevant Medications   olmesartan (BENICAR) 40 MG tablet     Digestive   GERD without esophagitis   Tried coming off ppi  Taking pepcid A lot of break through heart burn and epigastric burning  Some belching  Is avoiding triggers   Will start back omeprazole  but at 20 instead of 40  Take in am  Pepcid prn   Update if not starting to improve in a week or if worsening  Call back and Er precautions noted in detail today   Follow up 2-3 wk       Relevant Medications   omeprazole  (PRILOSEC) 20 MG capsule

## 2024-03-19 NOTE — Assessment & Plan Note (Signed)
 Tried coming off ppi  Taking pepcid A lot of break through heart burn and epigastric burning  Some belching  Is avoiding triggers   Will start back omeprazole  but at 20 instead of 40  Take in am  Pepcid prn   Update if not starting to improve in a week or if worsening  Call back and Er precautions noted in detail today   Follow up 2-3 wk

## 2024-03-19 NOTE — Patient Instructions (Addendum)
 Stop losartan and change olmesartan 40 mg daily  Alert if any side effects Watch blood pressure   If any side effects let us  know   Follow up in about 2-3 weeks and bring your cuff also   Eat well  Keep exercise    Try omeprazole  for acid reflux at 20 mg daily (first thing in am before food or other medicines)   You can still use pepcid if you need it

## 2024-03-23 IMAGING — MG DIGITAL DIAGNOSTIC BILAT W/ TOMO W/ CAD
6 of 10 series · 6 of 30 positions shown · non-contrast
Comparison: Prior exams, most recent dated 09/11/2012.

CLINICAL DATA: Patient presents with intermittent pain in the left
axilla region. No reported lumps.

EXAM:
DIGITAL DIAGNOSTIC BILATERAL MAMMOGRAM WITH TOMOSYNTHESIS AND CAD;
ULTRASOUND LEFT BREAST LIMITED
TECHNIQUE: Bilateral digital diagnostic mammography and breast tomosynthesis
was performed. The images were evaluated with computer-aided
detection.; Targeted ultrasound examination of the left breast was
performed.

[R MLO synth-2D]
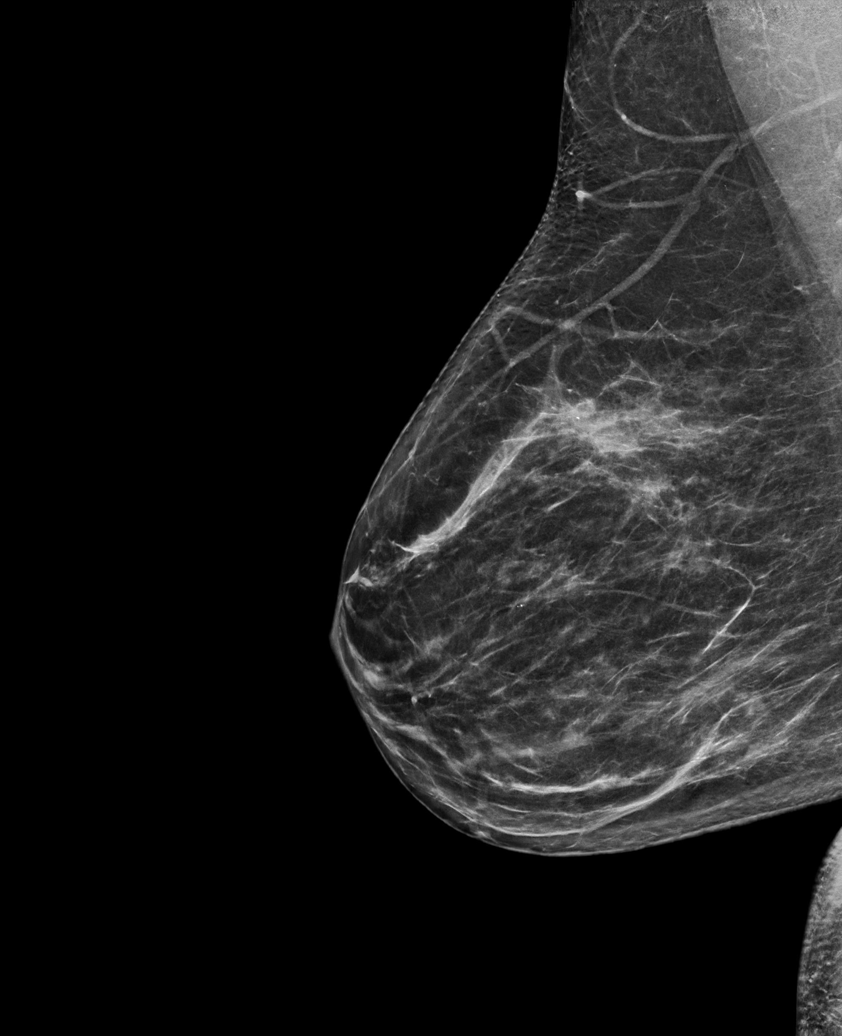

[L CC synth-2D (1 of 2)]
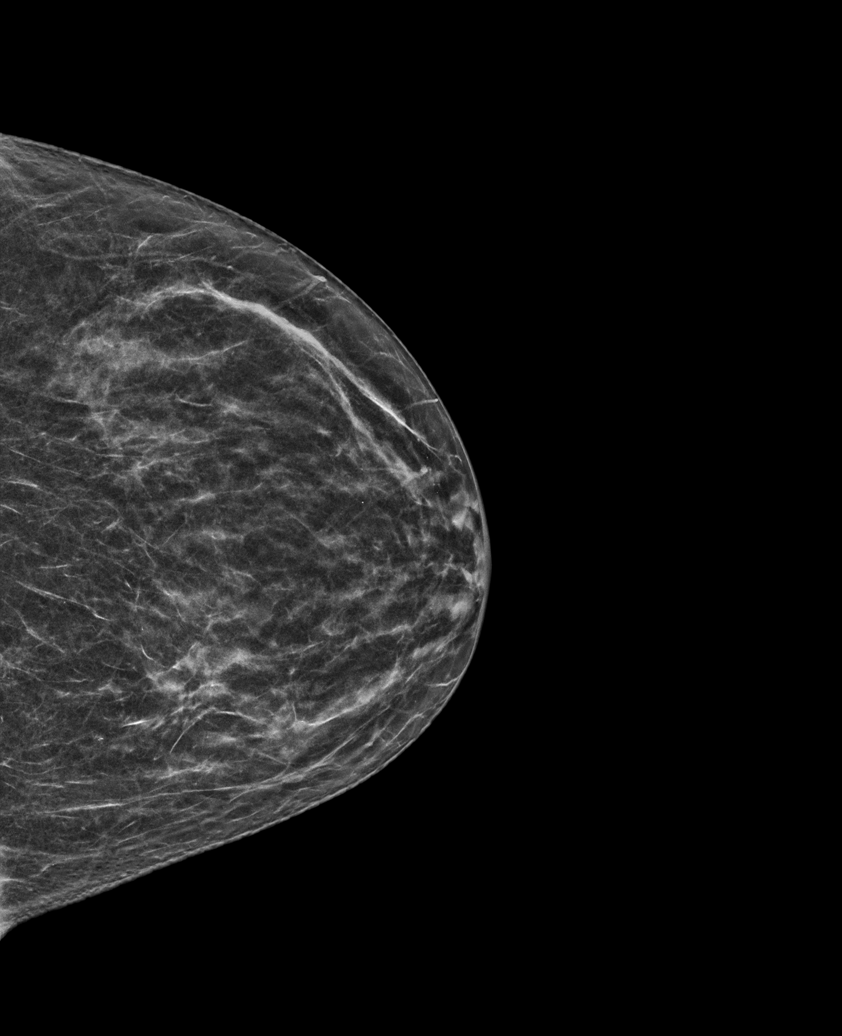

[R CC synth-2D]
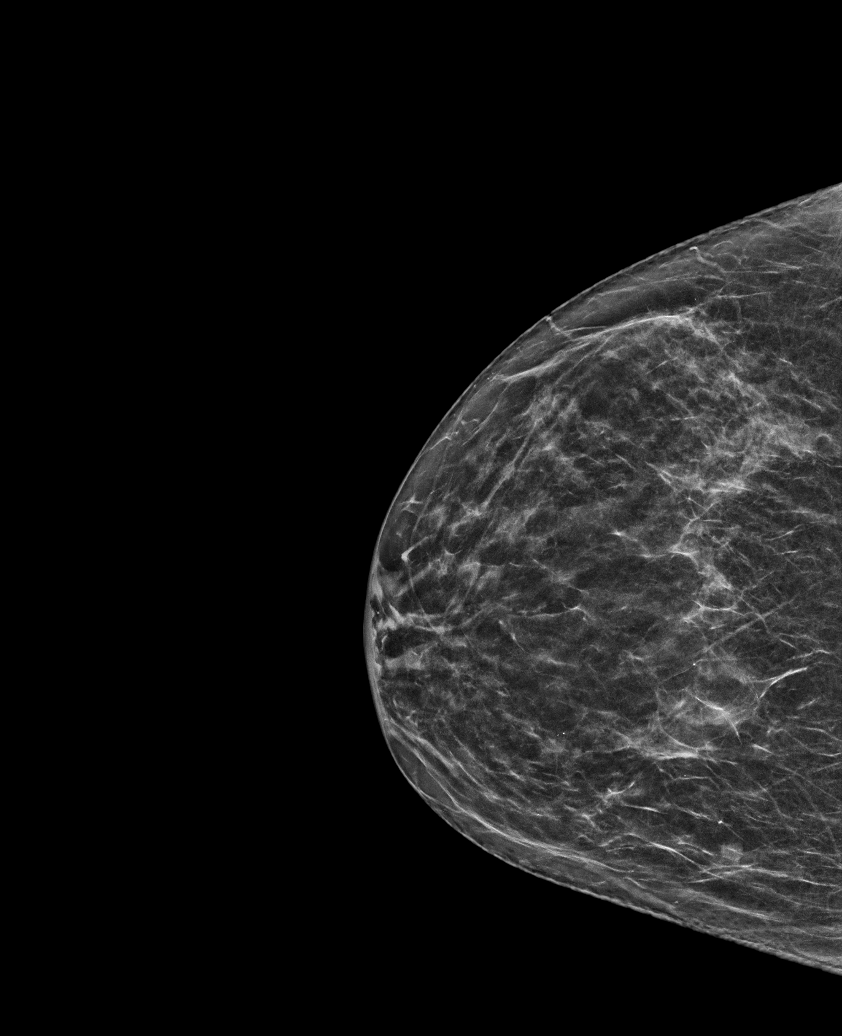

[L CC synth-2D (2 of 2)]
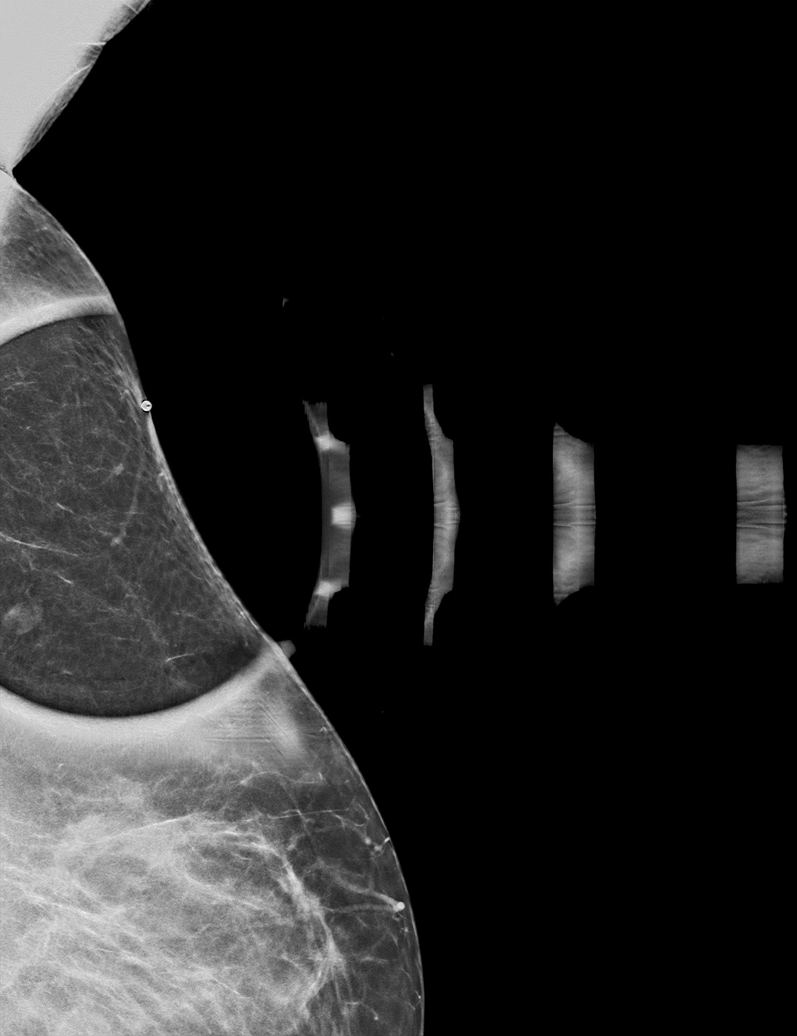

[L MLO synth-2D]
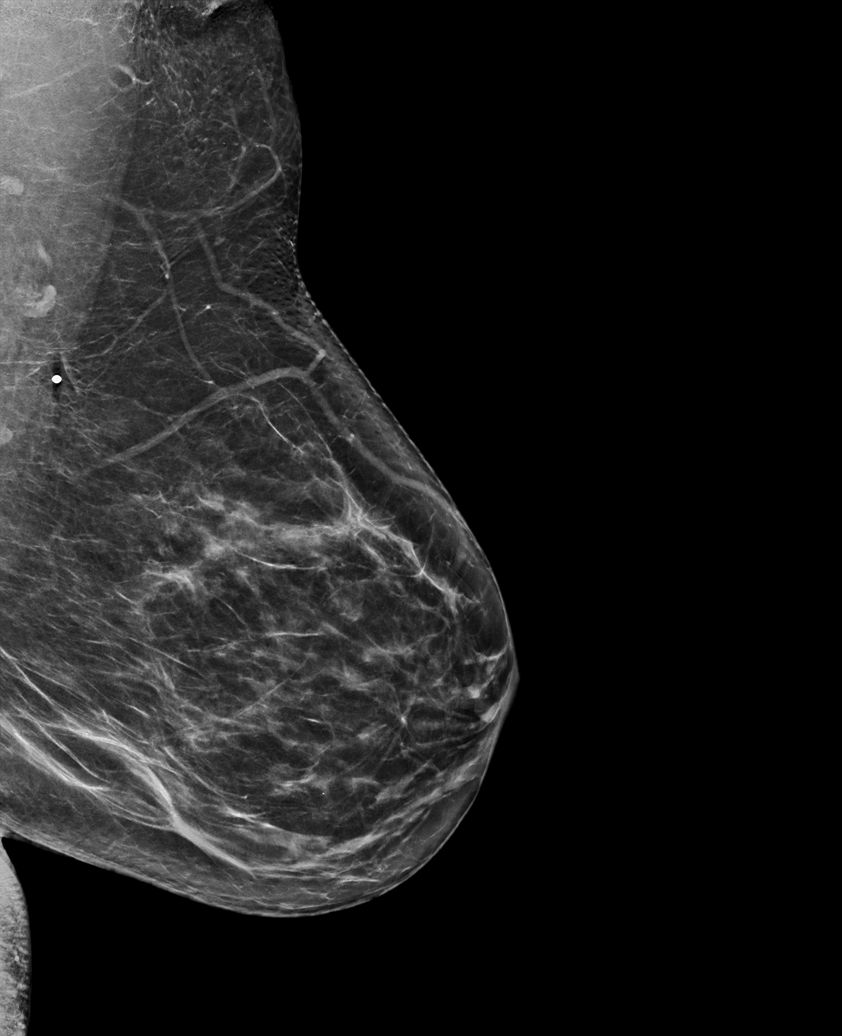

[L CC tomo · tomo slice 27/54.0]
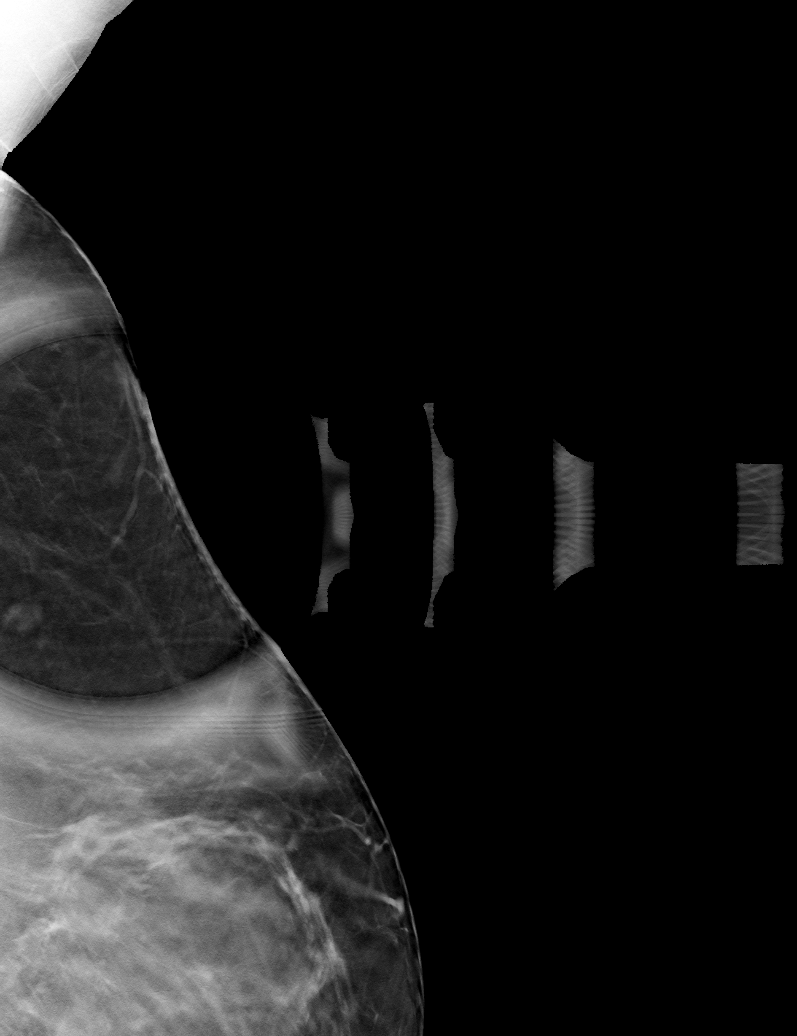

[6 of 30 positions shown; findings below may reference images not displayed]

ACR Breast Density Category b: There are scattered areas of
fibroglandular density.
FINDINGS: There are no suspicious masses, no areas of significant asymmetry,
no architectural distortion and no suspicious calcifications.

Targeted ultrasound is performed of the far lateral right breast and
right axilla, showing normal tissue and normal sized lymph nodes. No
masses. No abnormal lymph nodes.
IMPRESSION: No evidence of breast malignancy.

RECOMMENDATION:
Screening mammogram in one year.(Code:SJ-[id number])

I have discussed the findings and recommendations with the patient.
If applicable, a reminder letter will be sent to the patient
regarding the next appointment.

BI-RADS CATEGORY  1: Negative.

## 2024-03-25 DIAGNOSIS — F4312 Post-traumatic stress disorder, chronic: Secondary | ICD-10-CM | POA: Diagnosis not present

## 2024-03-25 DIAGNOSIS — F411 Generalized anxiety disorder: Secondary | ICD-10-CM | POA: Diagnosis not present

## 2024-04-04 ENCOUNTER — Other Ambulatory Visit: Payer: Self-pay | Admitting: Family Medicine

## 2024-04-09 ENCOUNTER — Ambulatory Visit (INDEPENDENT_AMBULATORY_CARE_PROVIDER_SITE_OTHER): Admitting: Family Medicine

## 2024-04-09 ENCOUNTER — Encounter: Payer: Self-pay | Admitting: Family Medicine

## 2024-04-09 VITALS — BP 141/85 | HR 100 | Temp 98.8°F | Ht 63.5 in | Wt 169.2 lb

## 2024-04-09 DIAGNOSIS — I10 Essential (primary) hypertension: Secondary | ICD-10-CM

## 2024-04-09 DIAGNOSIS — E119 Type 2 diabetes mellitus without complications: Secondary | ICD-10-CM

## 2024-04-09 DIAGNOSIS — J069 Acute upper respiratory infection, unspecified: Secondary | ICD-10-CM

## 2024-04-09 DIAGNOSIS — K219 Gastro-esophageal reflux disease without esophagitis: Secondary | ICD-10-CM | POA: Diagnosis not present

## 2024-04-09 MED ORDER — CHLORTHALIDONE 25 MG PO TABS: 12.5000 mg | ORAL_TABLET | Freq: Every day | ORAL | 0 refills | Status: AC

## 2024-04-09 NOTE — Assessment & Plan Note (Signed)
 Much improved back on omeprazole  20 mg daily  Watching diet  Pepcid prn   Will continue to monitor

## 2024-04-09 NOTE — Patient Instructions (Signed)
 Continue the olmesartan  40 mg   Start chlorthalidone  12.5 mg daily in the am  If any intolerable side effects hold it and let us  know    Work on healthy /exercise when you can   Follow up in 1-2 weeks for visit and labs that day   Continue the omeprazole     Drink fluids and rest  mucinex DM is good for cough and congestion  Nasal saline for congestion as needed  Tylenol for fever or pain or headache  Please alert us  if symptoms worsen (if severe or short of breath please go to the ER)

## 2024-04-09 NOTE — Assessment & Plan Note (Signed)
 Will be due for A1c at follow up  Managed bu diet  Given info on metformin last time

## 2024-04-09 NOTE — Progress Notes (Signed)
 Subjective:    Patient ID: Joan Ochoa, female    DOB: April 02, 1971, 53 y.o.   MRN: [id number]  HPI  Wt Readings from Last 3 Encounters:  04/09/24 169 lb 4 oz (76.8 kg)  03/19/24 170 lb (77.1 kg)  02/01/24 173 lb 2 oz (78.5 kg)   29.51 kg/m  Vitals:   04/09/24 0850 04/09/24 0912  BP: (!) 140/92 (!) 141/85  Pulse: 100   Temp: 98.8 F (37.1 C)   SpO2: 97%     Pt presents for  HTN GERD DM Cough after a cold   HTN  No cp or palpitations or headaches or edema  No side effects to medicines  BP Readings from Last 3 Encounters:  04/09/24 (!) 141/85  03/19/24 136/82  02/01/24 129/80     Lab Results  Component Value Date   NA 139 01/10/2024   K 4.1 01/10/2024   CO2 27 01/10/2024   GLUCOSE 166 (H) 01/10/2024   BUN 12 01/10/2024   CREATININE 0.93 01/10/2024   CALCIUM 9.3 01/10/2024   GFR 69.80 01/10/2024   GFRNONAA >60 06/27/2015   Last visit we changed losartan 100 to olmesartan  40 mg daily    Has 2 blood pressure cuffs Today 154/88 on one 139/88 on 2nd one   At home blood pressure is labile    140s-160s mostly over 80s to 90s   Pulse sometimes in 90s   GERD Last visit tried coming off ppi Symptoms had worsened with burning and bleching We started back on omeprazole  20 mg  So much better  No symptoms at all    Had a cold recently  Still coughing  Some phlegm but cannot spit it out  Lost her voice  Took formula 44D over the counter for cough-helped a lot  Took a benadryl last night -helped her sleep  No wheezing  Some nasal congestion  No fever  Did not take a covid    DM2 Lab Results  Component Value Date   HGBA1C 7.4 (H) 01/10/2024   HGBA1C 6.8 (A) 10/16/2023   HGBA1C 7.4 (H) 06/15/2023      Patient Active Problem List   Diagnosis Date Noted   Viral URI with cough 04/09/2024   Lesion of spleen 07/13/2023   Hepatic steatosis 07/13/2023   Elevated ALT measurement 07/12/2023   Encounter for screening mammogram for breast  cancer 07/12/2023   Routine general medical examination at a health care facility 07/12/2023   H/O malignant melanoma of back 06/15/2023   Encounter for screening for HIV 06/15/2023   Encounter for hepatitis C screening test for low risk patient 06/15/2023   Vitamin D  deficiency 06/15/2023   History of hypokalemia 06/15/2023   Colon cancer screening 06/15/2023   Anxiety and depression 08/22/2017   Depression 08/22/2017   Hyperlipidemia associated with type 2 diabetes mellitus (HCC) 08/22/2017   Migraine without aura and without status migrainosus, not intractable 08/22/2017   New onset type 2 diabetes mellitus (HCC) 08/22/2017   PTSD (post-traumatic stress disorder) 08/22/2017   Essential hypertension 08/18/2017   GERD without esophagitis 08/18/2017   Past Medical History:  Diagnosis Date   Allergy    Anxiety    Most of my life as adult   Depression 05/13/2014   Diabetes mellitus without complication (HCC)    GERD (gastroesophageal reflux disease)    History of dysplastic nevus 06/02/2020   MODERATE TO SEVERELY ATYPICAL MOLE at left upper back medial/excision    Hypertension 2009   Sleep apnea  2024   Have cpap   Past Surgical History:  Procedure Laterality Date   ABDOMINAL HYSTERECTOMY  2008   APPENDECTOMY  1990   CHOLECYSTECTOMY  2013   NASAL SINUS SURGERY     Social History   Tobacco Use   Smoking status: Never   Smokeless tobacco: Never  Substance Use Topics   Alcohol use: No   Drug use: Never   Family History  Problem Relation Age of Onset   Arthritis Mother    Depression Mother    Diabetes Mother    Hearing loss Mother    Hypertension Mother    Varicose Veins Mother    Cancer Father    COPD Father    Stroke Father    Depression Sister    Diabetes Sister    Hypertension Sister    Depression Sister    Diabetes Sister    Hypertension Sister    ADD / ADHD Daughter    ADD / ADHD Daughter    Breast cancer Paternal Aunt    Early death Maternal  Grandmother    Allergies  Allergen Reactions   Codeine Nausea And Vomiting   Prednisone Other (See Comments)    Hallucinations , body turns real red ,hot and shortness of breath    Current Outpatient Medications on File Prior to Visit  Medication Sig Dispense Refill   Biotin 5000 MCG SUBL Place under the tongue.     Cholecalciferol (VITAMIN D -1000 MAX ST) 25 MCG (1000 UT) tablet Take by mouth.     clonazePAM (KLONOPIN) 0.25 MG disintegrating tablet Take 0.25 mg by mouth daily as needed.     desvenlafaxine (PRISTIQ) 50 MG 24 hr tablet Take 50 mg by mouth daily.     famotidine (PEPCID) 10 MG tablet Take 10 mg by mouth daily as needed for heartburn or indigestion.     fluticasone (FLONASE) 50 MCG/ACT nasal spray Place into the nose.     olmesartan  (BENICAR ) 40 MG tablet Take 1 tablet (40 mg total) by mouth daily. 30 tablet 0   omeprazole  (PRILOSEC) 20 MG capsule Take 1 capsule (20 mg total) by mouth daily. 90 capsule 1   potassium gluconate 595 (99 K) MG TABS tablet Take by mouth.     traZODone (DESYREL) 100 MG tablet Take 100 mg by mouth at bedtime as needed for sleep.     No current facility-administered medications on file prior to visit.    Review of Systems  Constitutional:  Positive for fatigue. Negative for appetite change and fever.  HENT:  Positive for congestion, postnasal drip, rhinorrhea and sneezing. Negative for ear pain, sinus pressure and sore throat.   Eyes:  Negative for pain and discharge.  Respiratory:  Positive for cough. Negative for shortness of breath, wheezing and stridor.   Cardiovascular:  Negative for chest pain.  Gastrointestinal:  Negative for diarrhea, nausea and vomiting.  Genitourinary:  Negative for frequency, hematuria and urgency.  Musculoskeletal:  Negative for arthralgias and myalgias.  Skin:  Negative for rash.  Neurological:  Negative for dizziness, weakness, light-headedness and headaches.  Psychiatric/Behavioral:  Negative for confusion and  dysphoric mood.        Objective:   Physical Exam Constitutional:      General: She is not in acute distress.    Appearance: Normal appearance. She is well-developed. She is not ill-appearing, toxic-appearing or diaphoretic.     Comments: Overweight   HENT:     Head: Normocephalic and atraumatic.     Comments:  Nares are injected and congested      Right Ear: Tympanic membrane, ear canal and external ear normal.     Left Ear: Tympanic membrane, ear canal and external ear normal.     Nose: Congestion and rhinorrhea present.     Mouth/Throat:     Mouth: Mucous membranes are moist.     Pharynx: Oropharynx is clear. No oropharyngeal exudate or posterior oropharyngeal erythema.     Comments: Clear pnd  Eyes:     General:        Right eye: No discharge.        Left eye: No discharge.     Conjunctiva/sclera: Conjunctivae normal.     Pupils: Pupils are equal, round, and reactive to light.  Cardiovascular:     Rate and Rhythm: Normal rate.     Heart sounds: Normal heart sounds.  Pulmonary:     Effort: Pulmonary effort is normal. No respiratory distress.     Breath sounds: Normal breath sounds. No stridor. No wheezing, rhonchi or rales.     Comments: Good air exch  Some upper airway sounds with cough  No wheeze even on forced exp  Chest:     Chest wall: No tenderness.  Musculoskeletal:     Cervical back: Normal range of motion and neck supple.  Lymphadenopathy:     Cervical: No cervical adenopathy.  Skin:    General: Skin is warm and dry.     Capillary Refill: Capillary refill takes less than 2 seconds.     Findings: No rash.  Neurological:     Mental Status: She is alert.     Cranial Nerves: No cranial nerve deficit.  Psychiatric:        Mood and Affect: Mood normal.           Assessment & Plan:   Problem List Items Addressed This Visit       Cardiovascular and Mediastinum   Essential hypertension - Primary   bp not at goal Did test her cuffs today BP  Readings from Last 1 Encounters:  04/09/24 (!) 141/85   Continues olmesartan  40 mg daily  Will add chlorthalidone  12.5 mg daily (will call if any side effects)  Follow up 1-2 weeks for re check and labs  Most recent labs reviewed  Disc lifstyle change with low sodium diet and exercise        Relevant Medications   chlorthalidone  (HYGROTON ) 25 MG tablet     Respiratory   Viral URI with cough   Mild/improving Reassuring exam No wheeze  Disc symptomatic care - see instructions on AVS Call back and Er precautions noted in detail today   Update if not starting to improve in a week or if worsening          Digestive   GERD without esophagitis   Much improved back on omeprazole  20 mg daily  Watching diet  Pepcid prn   Will continue to monitor         Endocrine   New onset type 2 diabetes mellitus (HCC)   Will be due for A1c at follow up  Managed bu diet  Given info on metformin last time

## 2024-04-09 NOTE — Assessment & Plan Note (Signed)
 bp not at goal Did test her cuffs today BP Readings from Last 1 Encounters:  04/09/24 (!) 141/85   Continues olmesartan  40 mg daily  Will add chlorthalidone  12.5 mg daily (will call if any side effects)  Follow up 1-2 weeks for re check and labs  Most recent labs reviewed  Disc lifstyle change with low sodium diet and exercise

## 2024-04-09 NOTE — Assessment & Plan Note (Signed)
 Mild/improving Reassuring exam No wheeze  Disc symptomatic care - see instructions on AVS Call back and Er precautions noted in detail today   Update if not starting to improve in a week or if worsening

## 2024-04-13 ENCOUNTER — Other Ambulatory Visit: Payer: Self-pay | Admitting: Family Medicine

## 2024-04-23 ENCOUNTER — Ambulatory Visit: Admitting: Family Medicine

## 2024-04-24 ENCOUNTER — Ambulatory Visit: Payer: Self-pay | Admitting: Family Medicine

## 2024-04-24 ENCOUNTER — Ambulatory Visit: Admitting: Family Medicine

## 2024-04-24 ENCOUNTER — Encounter: Payer: Self-pay | Admitting: Family Medicine

## 2024-04-24 VITALS — BP 128/86 | HR 88 | Temp 99.0°F | Ht 63.5 in | Wt 168.0 lb

## 2024-04-24 DIAGNOSIS — I10 Essential (primary) hypertension: Secondary | ICD-10-CM | POA: Diagnosis not present

## 2024-04-24 DIAGNOSIS — E119 Type 2 diabetes mellitus without complications: Secondary | ICD-10-CM

## 2024-04-24 DIAGNOSIS — Z5320 Procedure and treatment not carried out because of patient's decision for unspecified reasons: Secondary | ICD-10-CM | POA: Insufficient documentation

## 2024-04-24 DIAGNOSIS — E876 Hypokalemia: Secondary | ICD-10-CM | POA: Insufficient documentation

## 2024-04-24 DIAGNOSIS — E1169 Type 2 diabetes mellitus with other specified complication: Secondary | ICD-10-CM | POA: Diagnosis not present

## 2024-04-24 DIAGNOSIS — E785 Hyperlipidemia, unspecified: Secondary | ICD-10-CM | POA: Diagnosis not present

## 2024-04-24 LAB — LIPID PANEL
Cholesterol: 233 mg/dL — ABNORMAL HIGH (ref 0–200)
HDL: 57.9 mg/dL (ref >39.00)
LDL Cholesterol: 135 mg/dL — ABNORMAL HIGH (ref 0–99)
NonHDL: 175.04
Total CHOL/HDL Ratio: 4
Triglycerides: 198 mg/dL — ABNORMAL HIGH (ref 0.0–149.0)
VLDL: 39.6 mg/dL (ref 0.0–40.0)

## 2024-04-24 LAB — COMPREHENSIVE METABOLIC PANEL WITH GFR
ALT: 33 U/L (ref 0–35)
AST: 25 U/L (ref 0–37)
Albumin: 4.7 g/dL (ref 3.5–5.2)
Alkaline Phosphatase: 80 U/L (ref 39–117)
BUN: 19 mg/dL (ref 6–23)
CO2: 31 meq/L (ref 19–32)
Calcium: 9.9 mg/dL (ref 8.4–10.5)
Chloride: 97 meq/L (ref 96–112)
Creatinine, Ser: 0.89 mg/dL (ref 0.40–1.20)
GFR: 73.43 mL/min (ref >60.00)
Glucose, Bld: 165 mg/dL — ABNORMAL HIGH (ref 70–99)
Potassium: 3.3 meq/L — ABNORMAL LOW (ref 3.5–5.1)
Sodium: 138 meq/L (ref 135–145)
Total Bilirubin: 0.6 mg/dL (ref 0.2–1.2)
Total Protein: 7.6 g/dL (ref 6.0–8.3)

## 2024-04-24 LAB — POCT GLYCOSYLATED HEMOGLOBIN (HGB A1C): Hemoglobin A1C: 6.8 % — AB (ref 4.0–5.6)

## 2024-04-24 MED ORDER — POTASSIUM CHLORIDE ER 10 MEQ PO TBCR: 10.0000 meq | EXTENDED_RELEASE_TABLET | Freq: Every day | ORAL | 0 refills | Status: DC

## 2024-04-24 NOTE — Assessment & Plan Note (Signed)
 Improved bp in fair control at this time  BP Readings from Last 1 Encounters:  04/24/24 128/86   No changes needed Most recent labs reviewed  Disc lifstyle change with low sodium diet and exercise  Omlesartan 40 mg dialy  Chlorthalidone  12.5 mg daily  Good lifestyle change  Lab today

## 2024-04-24 NOTE — Assessment & Plan Note (Signed)
 Lab Results  Component Value Date   HGBA1C 6.8 (A) 04/24/2024   HGBA1C 7.4 (H) 01/10/2024   HGBA1C 6.8 (A) 10/16/2023   Improved with diet/exercise  Doing great Declines metformin or other medicine  Microalb utd  On arb Declines statin   Follow up 3 mo

## 2024-04-24 NOTE — Assessment & Plan Note (Signed)
 Disc goals for lipids and reasons to control them Rev last labs with pt Rev low sat fat diet in detail  Goal for LDL 70 or below Pt declines statin  Discussed reasons to take/vascular protection -voiced understanding   Lab today  Doing very well with lifestyle change

## 2024-04-24 NOTE — Patient Instructions (Addendum)
 A statin is highly recommended for diabetics for vascular health  If you change your mind about that let us  know    Keep up the great work with diet and exercise   Lab for chem and lipid today   Blood pressure is better   Follow up in 3 months

## 2024-04-24 NOTE — Progress Notes (Signed)
 Subjective:    Patient ID: Joan Ochoa, female    DOB: [birth year], 53 y.o.   MRN: [id number]  HPI  Wt Readings from Last 3 Encounters:  04/24/24 168 lb (76.2 kg)  04/09/24 169 lb 4 oz (76.8 kg)  03/19/24 170 lb (77.1 kg)   29.29 kg/m  Vitals:   04/24/24 0854  BP: 128/86  Pulse: 88  Temp: 99 F (37.2 C)  SpO2: 97%    Pt presents for follow up of  HTN DM2 Hyperlipidemia  HTN bp is stable today  No cp or palpitations or headaches or edema  No side effects to medicines  BP Readings from Last 3 Encounters:  04/24/24 128/86  04/09/24 (!) 141/85  03/19/24 136/82     Lab Results  Component Value Date   NA 139 01/10/2024   K 4.1 01/10/2024   CO2 27 01/10/2024   GLUCOSE 166 (H) 01/10/2024   BUN 12 01/10/2024   CREATININE 0.93 01/10/2024   CALCIUM 9.3 01/10/2024   GFR 69.80 01/10/2024   GFRNONAA >60 06/27/2015    Olmesartan  40 mg daily  We added cholrthalidone 12.5 mg daily  (tolerates well)  Improved here and at home    DM2 Improved 6.8 today Lab Results  Component Value Date   HGBA1C 6.8 (A) 04/24/2024   HGBA1C 7.4 (H) 01/10/2024   HGBA1C 6.8 (A) 10/16/2023   Lab Results  Component Value Date   MICROALBUR 3.7 (H) 10/16/2023   Doing well with healthy diet and exercise   Trying to eat well -much better  Eating regularly  Protein -chicken, occational red meat  Beans  Lots of salads  Less sweets (not none)  Small amounts  Trying to eat apples when she craves sweets   Going ot the gym HIIT cardio and weights   Managed by diet  Wants to avoid medication if possible  Stretching   Goal of weight loss before niece's wedding in August    Hyperlipidemia Lab Results  Component Value Date   CHOL 256 (H) 01/10/2024   HDL 56.20 01/10/2024   LDLCALC 162 (H) 01/10/2024   TRIG 189.0 (H) 01/10/2024   CHOLHDL 5 01/10/2024       Patient Active Problem List   Diagnosis Date Noted   Statin declined 04/24/2024   Lesion of spleen  07/13/2023   Hepatic steatosis 07/13/2023   Elevated ALT measurement 07/12/2023   Encounter for screening mammogram for breast cancer 07/12/2023   Routine general medical examination at a health care facility 07/12/2023   H/O malignant melanoma of back 06/15/2023   Encounter for screening for HIV 06/15/2023   Encounter for hepatitis C screening test for low risk patient 06/15/2023   Vitamin D  deficiency 06/15/2023   History of hypokalemia 06/15/2023   Colon cancer screening 06/15/2023   Anxiety and depression 08/22/2017   Depression 08/22/2017   Hyperlipidemia associated with type 2 diabetes mellitus (HCC) 08/22/2017   Migraine without aura and without status migrainosus, not intractable 08/22/2017   New onset type 2 diabetes mellitus (HCC) 08/22/2017   PTSD (post-traumatic stress disorder) 08/22/2017   Essential hypertension 08/18/2017   GERD without esophagitis 08/18/2017   Past Medical History:  Diagnosis Date   Allergy    Anxiety    Most of my life as adult   Depression 05/13/2014   Diabetes mellitus without complication (HCC)    GERD (gastroesophageal reflux disease)    History of dysplastic nevus 06/02/2020   MODERATE TO SEVERELY ATYPICAL MOLE at left upper  back medial/excision    Hypertension 2009   Sleep apnea 2024   Have cpap   Past Surgical History:  Procedure Laterality Date   ABDOMINAL HYSTERECTOMY  2008   APPENDECTOMY  1990   CHOLECYSTECTOMY  2013   NASAL SINUS SURGERY     Social History   Tobacco Use   Smoking status: Never   Smokeless tobacco: Never  Substance Use Topics   Alcohol use: No   Drug use: Never   Family History  Problem Relation Age of Onset   Arthritis Mother    Depression Mother    Diabetes Mother    Hearing loss Mother    Hypertension Mother    Varicose Veins Mother    Cancer Father    COPD Father    Stroke Father    Depression Sister    Diabetes Sister    Hypertension Sister    Depression Sister    Diabetes Sister     Hypertension Sister    ADD / ADHD Daughter    ADD / ADHD Daughter    Breast cancer Paternal Aunt    Early death Maternal Grandmother    Allergies  Allergen Reactions   Codeine Nausea And Vomiting   Prednisone Other (See Comments)    Hallucinations , body turns real red ,hot and shortness of breath    Current Outpatient Medications on File Prior to Visit  Medication Sig Dispense Refill   Biotin 5000 MCG SUBL Place under the tongue.     chlorthalidone  (HYGROTON ) 25 MG tablet Take 0.5 tablets (12.5 mg total) by mouth daily. In the am 45 tablet 0   Cholecalciferol (VITAMIN D -1000 MAX ST) 25 MCG (1000 UT) tablet Take by mouth.     clonazePAM (KLONOPIN) 0.25 MG disintegrating tablet Take 0.25 mg by mouth daily as needed.     desvenlafaxine (PRISTIQ) 50 MG 24 hr tablet Take 50 mg by mouth daily.     famotidine (PEPCID) 10 MG tablet Take 10 mg by mouth daily as needed for heartburn or indigestion.     fluticasone (FLONASE) 50 MCG/ACT nasal spray Place into the nose.     olmesartan  (BENICAR ) 40 MG tablet TAKE 1 TABLET (40 MG TOTAL) BY MOUTH DAILY. 90 tablet 1   omeprazole  (PRILOSEC) 20 MG capsule Take 1 capsule (20 mg total) by mouth daily. 90 capsule 1   potassium gluconate 595 (99 K) MG TABS tablet Take by mouth.     traZODone (DESYREL) 100 MG tablet Take 100 mg by mouth at bedtime as needed for sleep.     No current facility-administered medications on file prior to visit.    Review of Systems  Constitutional:  Negative for activity change, appetite change, fatigue, fever and unexpected weight change.  HENT:  Negative for congestion, ear pain, rhinorrhea, sinus pressure and sore throat.   Eyes:  Negative for pain, redness and visual disturbance.  Respiratory:  Negative for cough, shortness of breath and wheezing.   Cardiovascular:  Negative for chest pain and palpitations.  Gastrointestinal:  Negative for abdominal pain, blood in stool, constipation and diarrhea.  Endocrine: Negative  for polydipsia and polyuria.  Genitourinary:  Negative for dysuria, frequency and urgency.  Musculoskeletal:  Negative for arthralgias, back pain and myalgias.  Skin:  Negative for pallor and rash.  Allergic/Immunologic: Negative for environmental allergies.  Neurological:  Negative for dizziness, syncope and headaches.  Hematological:  Negative for adenopathy. Does not bruise/bleed easily.  Psychiatric/Behavioral:  Negative for decreased concentration and dysphoric mood. The  patient is not nervous/anxious.        Objective:   Physical Exam Constitutional:      General: She is not in acute distress.    Appearance: Normal appearance. She is well-developed. She is not ill-appearing or diaphoretic.     Comments: Overweight   HENT:     Head: Normocephalic and atraumatic.  Eyes:     Conjunctiva/sclera: Conjunctivae normal.     Pupils: Pupils are equal, round, and reactive to light.  Neck:     Thyroid : No thyromegaly.     Vascular: No carotid bruit or JVD.  Cardiovascular:     Rate and Rhythm: Normal rate and regular rhythm.     Heart sounds: Normal heart sounds.     No gallop.  Pulmonary:     Effort: Pulmonary effort is normal. No respiratory distress.     Breath sounds: Normal breath sounds. No wheezing or rales.  Abdominal:     General: There is no distension or abdominal bruit.     Palpations: Abdomen is soft.  Musculoskeletal:     Cervical back: Normal range of motion and neck supple.     Right lower leg: No edema.     Left lower leg: No edema.  Lymphadenopathy:     Cervical: No cervical adenopathy.  Skin:    General: Skin is warm and dry.     Coloration: Skin is not pale.     Findings: No rash.  Neurological:     Mental Status: She is alert.     Coordination: Coordination normal.     Deep Tendon Reflexes: Reflexes are normal and symmetric. Reflexes normal.  Psychiatric:        Mood and Affect: Mood normal.           Assessment & Plan:   Problem List Items  Addressed This Visit       Cardiovascular and Mediastinum   Essential hypertension - Primary   Improved bp in fair control at this time  BP Readings from Last 1 Encounters:  04/24/24 128/86   No changes needed Most recent labs reviewed  Disc lifstyle change with low sodium diet and exercise  Omlesartan 40 mg dialy  Chlorthalidone  12.5 mg daily  Good lifestyle change  Lab today      Relevant Orders   Lipid panel   Comprehensive metabolic panel with GFR     Endocrine   New onset type 2 diabetes mellitus (HCC)   Lab Results  Component Value Date   HGBA1C 6.8 (A) 04/24/2024   HGBA1C 7.4 (H) 01/10/2024   HGBA1C 6.8 (A) 10/16/2023   Improved with diet/exercise  Doing great Declines metformin or other medicine  Microalb utd  On arb Declines statin   Follow up 3 mo      Relevant Orders   POCT HgB A1C (Completed)   Hyperlipidemia associated with type 2 diabetes mellitus (HCC)   Disc goals for lipids and reasons to control them Rev last labs with pt Rev low sat fat diet in detail  Goal for LDL 70 or below Pt declines statin  Discussed reasons to take/vascular protection -voiced understanding   Lab today  Doing very well with lifestyle change       Relevant Orders   Lipid panel     Other   Statin declined

## 2024-04-26 ENCOUNTER — Telehealth: Payer: Self-pay | Admitting: *Deleted

## 2024-04-26 NOTE — Telephone Encounter (Signed)
 Pt viewed labs via mychart but per Dr. Randeen:  Re check bmet in 7-10 days please   *please schedule non fasting lab appt please. Thanks*

## 2024-04-29 NOTE — Telephone Encounter (Signed)
 lvm for pt to call office to schedule appt fasting labs

## 2024-05-02 ENCOUNTER — Ambulatory Visit: Admitting: Family Medicine

## 2024-06-02 ENCOUNTER — Telehealth: Admitting: Physician Assistant

## 2024-06-02 DIAGNOSIS — R3989 Other symptoms and signs involving the genitourinary system: Secondary | ICD-10-CM

## 2024-06-03 MED ORDER — NITROFURANTOIN MONOHYD MACRO 100 MG PO CAPS: 100.0000 mg | ORAL_CAPSULE | Freq: Two times a day (BID) | ORAL | 0 refills | Status: AC

## 2024-06-03 NOTE — Progress Notes (Signed)

## 2024-06-04 ENCOUNTER — Other Ambulatory Visit: Payer: Self-pay | Admitting: Family Medicine

## 2024-07-23 ENCOUNTER — Ambulatory Visit: Admitting: Family Medicine

## 2024-10-02 ENCOUNTER — Ambulatory Visit
